# Patient Record
Sex: Male | Born: 1960 | Race: White | Hispanic: No | Marital: Married | Smoking: Former smoker
Health system: Southern US, Community
[De-identification: ages and names within clinical notes are randomized; demographics above are authoritative.]

## PROBLEM LIST (undated history)

## (undated) DIAGNOSIS — M549 Dorsalgia, unspecified: Principal | ICD-10-CM

## (undated) DIAGNOSIS — F329 Major depressive disorder, single episode, unspecified: Secondary | ICD-10-CM

## (undated) DIAGNOSIS — I2510 Atherosclerotic heart disease of native coronary artery without angina pectoris: Secondary | ICD-10-CM

## (undated) DIAGNOSIS — F32A Anxiety disorder, unspecified: Secondary | ICD-10-CM

## (undated) DIAGNOSIS — F419 Anxiety disorder, unspecified: Secondary | ICD-10-CM

## (undated) DIAGNOSIS — I219 Acute myocardial infarction, unspecified: Secondary | ICD-10-CM

## (undated) DIAGNOSIS — G459 Transient cerebral ischemic attack, unspecified: Secondary | ICD-10-CM

## (undated) DIAGNOSIS — G8929 Other chronic pain: Secondary | ICD-10-CM

## (undated) HISTORY — DX: Dorsalgia, unspecified: M54.9

## (undated) HISTORY — DX: Anxiety disorder, unspecified: F32.A

## (undated) HISTORY — DX: Major depressive disorder, single episode, unspecified: F32.9

## (undated) HISTORY — PX: TONSILLECTOMY: SUR1361

## (undated) HISTORY — DX: Transient cerebral ischemic attack, unspecified: G45.9

## (undated) HISTORY — DX: Acute myocardial infarction, unspecified: I21.9

## (undated) HISTORY — DX: Other chronic pain: G89.29

## (undated) HISTORY — PX: BACK SURGERY: SHX140

## (undated) HISTORY — DX: Anxiety disorder, unspecified: F41.9

## (undated) HISTORY — DX: Atherosclerotic heart disease of native coronary artery without angina pectoris: I25.10

---

## 2002-07-24 ENCOUNTER — Encounter: Admission: RE | Admit: 2002-07-24 | Discharge: 2002-07-24 | Payer: Self-pay | Admitting: Neurosurgery

## 2002-07-24 ENCOUNTER — Encounter: Payer: Self-pay | Admitting: Neurosurgery

## 2002-09-11 ENCOUNTER — Encounter: Payer: Self-pay | Admitting: Neurosurgery

## 2002-09-11 ENCOUNTER — Inpatient Hospital Stay (HOSPITAL_COMMUNITY): Admission: RE | Admit: 2002-09-11 | Discharge: 2002-09-15 | Payer: Self-pay | Admitting: Neurosurgery

## 2002-10-29 ENCOUNTER — Encounter: Payer: Self-pay | Admitting: Neurosurgery

## 2002-10-29 ENCOUNTER — Encounter: Admission: RE | Admit: 2002-10-29 | Discharge: 2002-10-29 | Payer: Self-pay | Admitting: Neurosurgery

## 2002-12-17 ENCOUNTER — Encounter: Admission: RE | Admit: 2002-12-17 | Discharge: 2002-12-17 | Payer: Self-pay | Admitting: Neurosurgery

## 2002-12-17 ENCOUNTER — Encounter: Payer: Self-pay | Admitting: Neurosurgery

## 2007-05-20 ENCOUNTER — Ambulatory Visit (HOSPITAL_COMMUNITY): Admission: RE | Admit: 2007-05-20 | Discharge: 2007-05-20 | Payer: Self-pay | Admitting: Family Medicine

## 2007-05-29 ENCOUNTER — Ambulatory Visit (HOSPITAL_COMMUNITY): Admission: RE | Admit: 2007-05-29 | Discharge: 2007-05-29 | Payer: Self-pay | Admitting: Anesthesiology

## 2007-05-30 ENCOUNTER — Ambulatory Visit (HOSPITAL_COMMUNITY): Admission: RE | Admit: 2007-05-30 | Discharge: 2007-05-30 | Payer: Self-pay | Admitting: Anesthesiology

## 2007-05-31 ENCOUNTER — Emergency Department (HOSPITAL_COMMUNITY): Admission: EM | Admit: 2007-05-31 | Discharge: 2007-05-31 | Payer: Self-pay | Admitting: Emergency Medicine

## 2009-05-03 ENCOUNTER — Encounter: Admission: RE | Admit: 2009-05-03 | Discharge: 2009-05-03 | Payer: Self-pay | Admitting: Neurosurgery

## 2009-05-23 ENCOUNTER — Ambulatory Visit (HOSPITAL_COMMUNITY): Admission: RE | Admit: 2009-05-23 | Discharge: 2009-05-24 | Payer: Self-pay | Admitting: Neurosurgery

## 2009-06-21 ENCOUNTER — Encounter: Admission: RE | Admit: 2009-06-21 | Discharge: 2009-06-21 | Payer: Self-pay | Admitting: Neurosurgery

## 2009-08-18 ENCOUNTER — Encounter: Admission: RE | Admit: 2009-08-18 | Discharge: 2009-08-18 | Payer: Self-pay | Admitting: Neurosurgery

## 2009-09-21 ENCOUNTER — Encounter: Admission: RE | Admit: 2009-09-21 | Discharge: 2009-09-21 | Payer: Self-pay | Admitting: Neurosurgery

## 2009-11-24 DIAGNOSIS — I219 Acute myocardial infarction, unspecified: Secondary | ICD-10-CM

## 2009-11-24 HISTORY — DX: Acute myocardial infarction, unspecified: I21.9

## 2010-01-24 DIAGNOSIS — G459 Transient cerebral ischemic attack, unspecified: Secondary | ICD-10-CM

## 2010-01-24 HISTORY — DX: Transient cerebral ischemic attack, unspecified: G45.9

## 2010-02-01 ENCOUNTER — Encounter: Admission: RE | Admit: 2010-02-01 | Discharge: 2010-02-01 | Payer: Self-pay | Admitting: Neurosurgery

## 2010-04-16 ENCOUNTER — Encounter: Payer: Self-pay | Admitting: Neurosurgery

## 2010-04-16 ENCOUNTER — Encounter: Payer: Self-pay | Admitting: Anesthesiology

## 2010-06-14 LAB — TYPE AND SCREEN: ABO/RH(D): A POS

## 2010-06-14 LAB — DIFFERENTIAL
Basophils Relative: 0 % (ref 0–1)
Eosinophils Absolute: 0.2 10*3/uL (ref 0.0–0.7)
Lymphocytes Relative: 22 % (ref 12–46)
Neutro Abs: 6 10*3/uL (ref 1.7–7.7)
Neutrophils Relative %: 71 % (ref 43–77)

## 2010-06-14 LAB — CBC
MCHC: 34 g/dL (ref 30.0–36.0)
MCV: 90.3 fL (ref 78.0–100.0)

## 2010-06-14 LAB — ABO/RH: ABO/RH(D): A POS

## 2010-06-14 LAB — SURGICAL PCR SCREEN: MRSA, PCR: NEGATIVE

## 2010-08-02 ENCOUNTER — Other Ambulatory Visit: Payer: Self-pay | Admitting: Neurosurgery

## 2010-08-02 DIAGNOSIS — M545 Low back pain, unspecified: Secondary | ICD-10-CM

## 2010-08-08 NOTE — Consult Note (Signed)
NAMEDESHAWN, WITTY NO.:  [id number]   MEDICAL RECORD NO.:  [id number]          PATIENT TYPE:  EMS   LOCATION:  ED                           FACILITY:  [facility]   PHYSICIAN:  Alvy Beal, MD    DATE OF BIRTH:  Jun 01, 1960   DATE OF CONSULTATION:  DATE OF DISCHARGE:  05/31/2007                                 CONSULTATION   HISTORY:  Charles Gross is a very pleasant 50 year old gentleman with chronic  low back pain for which he is on chronic pain medications.  He states  that in mid January he went to a fantasy baseball camp, and he kind of  felt like he injured his left anterior groin.  He has been walking on it  with pain ever since.  Ultimately he was referred for an MRI, and the  results returned today with a stress fracture of the left femoral neck.  As a result, he was instructed to go to the ER for orthopedic evaluation  by his pain medical management specialist.  As a result, I was consulted  for further management.   His past medical, surgical, family, and social history includes chronic  low back pain.  He is otherwise healthy.  He is currently taking  multiple medications for his chronic pain.   He is otherwise healthy with no other significant medical problems per  his report.   CLINICAL EXAMINATION:  He is a pleasant gentleman and appears stated  age.  He is able to stand, but he does have weightbearing pain on the  left side.  He has minimal pain with internal and external rotation of  the hip.  There is no leg length discrepancy.  There is no abnormal  rotation of the lower extremity.  He has no knee or ankle pain with  palpation.  He is neurovascularly intact.   The MRI does demonstrate a stress reaction and T1 changes consistent  with a stress fracture of the compression side of the femoral neck.   CLINICAL IMPRESSION:  Stress fracture.  At this point in time because he  does have weightbearing pain I am going to put him on crutches with  strict  nonweightbearing.  I will have him see Dr. Charlann Boxer either Monday or  Tuesday to discuss further management, whether that would be surgical  intervention or ongoing conservative management with crutches.  At this  point all of his questions were addressed.  I did indicate that if he  fails to be able to ambulate with nonweightbearing with crutches then  more than likely we will have to admit him for physical therapy.      Alvy Beal, MD  Electronically Signed     DDB/MEDQ  D:  05/31/2007  T:  06/02/2007  Job:  [id number]   cc:   Dr. Jacqulyn Bath  Pain Medical Management Specialist

## 2010-08-11 NOTE — Op Note (Signed)
NAMELINO, WICKLIFF                         ACCOUNT NO.:  [id number]   MEDICAL RECORD NO.:  [id number]                   PATIENT TYPE:  INP   LOCATION:  2899                                 FACILITY:  [facility]   PHYSICIAN:  Sherilyn Cooter A. Pool, M.D.                 DATE OF BIRTH:  Nov 10, 1960   DATE OF PROCEDURE:  09/11/2002  DATE OF DISCHARGE:                                 OPERATIVE REPORT   PREOPERATIVE DIAGNOSIS:  L4-5 and L5-S1 degenerative disk disease with  instability and stenosis.   POSTOPERATIVE DIAGNOSIS:  L4-5 and L5-S1 degenerative disk disease with  instability and stenosis.   PROCEDURE:  1. Re-exploration of L4-5 laminectomy with complete L4-5 laminectomy and     foraminotomies.  2. Re-exploration of L5-S1 laminectomy with complete laminectomy and     foraminotomies.  3. L4-5 and L5-S1 posterior lumbar interbody fusion utilizing Tangent wedges     and local autograft.  4. L4-5 and L5-S1 posterolateral fusion utilizing pedicle screw     instrumentation and local autografting.   SURGEON:  Kathaleen Maser. Pool, M.D.   ASSISTANT:  Donalee Citrin, M.D.   ANESTHESIA:  General endotracheal.   INDICATIONS:  Mr. Juhnke is a 50 year old male with a history of severe  back pain and bilateral lower extremity symptoms failing all conservative  management.  Workup has demonstrated evidence of postlaminectomy instability  with severe degenerative disk disease at the L4-5 and L5-S1 levels.  We  discussed options available for management, including the possibility of  undergoing an L4-5 and L5-S1 decompression and fusion with instrumentation  for hopeful improvement in his symptoms.  The patient is aware of the risks  and benefits and wishes to proceed.   OPERATIVE NOTE:  The patient was placed on the operating table in the supine  position.  After an adequate level of anesthesia was achieved, the patient  was positioned prone onto a Wilson frame and appropriately padded.  The  patient's  lumbar region was prepped and draped sterilely.  A 10 blade was  used to make a linear skin incision overlying the L3, L4, L5, and S1 levels.  This was carried down sharply in the midline.  A subperiosteal dissection  was then performed, exposing the laminae and facet joints of L3, L4, L5, and  S1.  Dissection proceeded out laterally, and the transverse processes of L4  and L5 and the sacral ala were then exposed bilaterally.  A deep self-  retaining retractor was placed.  The previous laminectomy at L4-5 was  dissected free using dental instruments, as was the previous laminotomy at  L5-S1.  Complete laminectomies at L4, L5, and S1 were then performed using  Kerrison rongeurs, Leksell rongeurs, and the high-speed drill.  All elements  of the laminae of L5 an L4 were completely removed.  The superior aspect of  the S1 lamina was also removed.  Inferior facets of L4  and L5 were  completely removed bilaterally.  Superior facets of L5 and S1 were also  resected bilaterally.  Underlying thecal sac and exiting L4, L5, and S1  nerve roots were identified.  Wide foraminotomies were performed along the  course of the exiting nerve root.  Epidural venous plexus is coagulated and  cut.  Epidural scar was dissected free and lysed.  Starting first at L5-S1  on the right side, thecal sac and nerve roots were gradually mobilized and  retracted toward the midline.  The disk space was identified and incised  with a 15 blade in a rectangular fashion.  A wide disk space clean-out was  then achieved using pituitary rongeurs, upward-angled pituitary rongeurs,  and Epstein curettes.  All loose or obviously degenerative disk material was  removed from the interspace.  A 10 mm distractor was left on the patient's  right side.  Attention was then placed to the left.  The disk space was then  incised with the nerve roots protected.  An aggressive diskectomy was once  again performed.  Furthermore, 10 mm Tangent  instruments were introduced.  A  10 mm reamer was used to clean away the remaining disk material, and a 10 mm  chisel was used to prepare the vertebral end plates.  All soft tissue was  removed from the interspace.  A 10 x 26 mm Tangent wedge was then impacted  into place and recessed approximately 1 mm from the posterior cortical  margin.  The distractor was removed from the patient's right side.  The disk  space was then reamed and then cut with a 10 mm chisel on the right.  The  thecal sac and nerve roots continued to be protected.  The interspace was  then packed with morcellized autograft and then a second 10 x 26 mm wedge  was impacted into place.  The procedure was then repeated bilaterally at the  L4-5 level, again without complication.  The pedicles at L4, L5, and S1 were  then isolated using surface landmarks and intraoperative fluoroscopy.  Superficial bone overlying the pedicles was removed using the high-speed  drill.  Each pedicle was then probed using a pedicle awl.  Each pedicle awl  track was found to be solidly within bone.  Each pedicle awl track was then  tapped with a 5.25 mm screw tap.  Spiral 90 6.75 x 45 mm screws were placed  bilaterally at L4, 6.75 x 40 mm screws were placed bilaterally at L5, and  6.75 x 35 mm screws were placed bilaterally at S1.  The transverse processes  and sacral alae were then decorticated using the high-speed drill.  Morcellized autograft was packed posterolaterally.  A segment of titanium  rod was then contoured and placed over the screw heads at L4, L5, and S1.  Locking caps were then engaged in a sequential fashion with the construct  under compression.  A transverse connector.  Final images revealed good  position of bone grafts and hardware, proper operative level, with normal  alignment of the spine.  A small inadvertent dural laceration was oversewn using 5-0 Prolene.  Duragen and Tisseel fibrin glue sealant were used to  help  further close this.  Gelfoam was placed in the epidural space.  A  medium Hemovac drain was left in the epidural space.  The wound was then  closed in a typical fashion using layers of Vicryl sutures.  Steri-Strips  and sterile dressing were applied.  There were no  apparent complications.  The patient tolerated the procedure well, and he returns to the recovery  room postoperatively.                                               Henry A. Pool, M.D.    HAP/MEDQ  D:  09/11/2002  T:  09/14/2002  Job:  [id number]

## 2010-08-11 NOTE — Discharge Summary (Signed)
   Charles, Gross                         ACCOUNT NO.:  [id number]   MEDICAL RECORD NO.:  [id number]                   PATIENT TYPE:  INP   LOCATION:  3009                                 FACILITY:  [facility]   PHYSICIAN:  Sherilyn Cooter A. Pool, M.D.                 DATE OF BIRTH:  [birth year]   DATE OF ADMISSION:  09/11/2002  DATE OF DISCHARGE:  09/15/2002                                 DISCHARGE SUMMARY   FINAL DIAGNOSIS:  L4-5 and L5-S1 degenerative disk disease with stenosis.   HOSPITAL COURSE:  The patient was taken to the operating room where an  uncomplicated two-level decompression and fusion with instrumentation was  performed.  Postoperatively, the patient did well.  He was gradually  mobilized without difficulty.  Pain management was improved.   CONDITION AT DISCHARGE:  Improved.   FOLLOW UP:  Discharge followup is in one week in my office.                                                Henry A. Pool, M.D.    HAP/MEDQ  D:  10/29/2002  T:  10/29/2002  Job:  [id number]

## 2010-08-15 ENCOUNTER — Other Ambulatory Visit: Payer: Self-pay | Admitting: Neurosurgery

## 2010-08-15 DIAGNOSIS — M545 Low back pain, unspecified: Secondary | ICD-10-CM

## 2010-08-17 ENCOUNTER — Ambulatory Visit
Admission: RE | Admit: 2010-08-17 | Discharge: 2010-08-17 | Disposition: A | Discharge status: Home / Self Care | Payer: BC Managed Care – PPO | Source: Ambulatory Visit | Attending: Neurosurgery | Admitting: Neurosurgery

## 2010-08-17 DIAGNOSIS — M545 Low back pain, unspecified: Secondary | ICD-10-CM

## 2011-02-08 ENCOUNTER — Ambulatory Visit (INDEPENDENT_AMBULATORY_CARE_PROVIDER_SITE_OTHER): Payer: BC Managed Care – PPO | Admitting: Internal Medicine

## 2011-02-08 ENCOUNTER — Encounter: Payer: Self-pay | Admitting: Internal Medicine

## 2011-02-08 VITALS — BP 120/80 | HR 82 | Temp 98.6°F | Ht 72.0 in | Wt 184.0 lb

## 2011-02-08 DIAGNOSIS — G8929 Other chronic pain: Secondary | ICD-10-CM | POA: Insufficient documentation

## 2011-02-08 DIAGNOSIS — M549 Dorsalgia, unspecified: Secondary | ICD-10-CM

## 2011-02-08 NOTE — Assessment & Plan Note (Addendum)
Patient is here exclusively for pain management, which is not my specialty. I wonder why his previous PCP or his back surgeon are declining to prescribe him pain medication. He states that he is back surgeon has already refer him to pain management and he has an appointment in 2 months. With the amount of medicine that he takes and particularly with  the concomitant use of Fioricet and soma I don't think is appropriate for me to prescribe his pain medicines going outside the boundaries of my specialty. He is concerned about withdrawal and so I am . He is recommended to go to the ER should he develop symptoms of withdrawal such as nausea, mental status changes, tachycardia etc.

## 2011-02-08 NOTE — Progress Notes (Signed)
  Subjective:    Patient ID: Charles Gross, male    DOB: 1961-01-17, 50 y.o.   MRN: [id number]  HPI New patient He is here specifically for pain management, reports a long history of back pain, reports that Dr. Dutch Quint, his back surgeon has been prescribing all his medications but lately has  declined to keep prescribing them and he has been referred to a  pain management specialist. He also has a PCP, Dr.Kelly who will not prescribe his pain medicine for him. "I don't have a good relationship with him"  Past Medical History  Diagnosis Date  . Myocardial infarction 11-2009  . TIA (transient ischemic attack) 01-2010  . Chronic back pain since 2000  . Migraine   . Anxiety and depression    Past Surgical History  Procedure Date  . Back surgery     x1 [place], fusion 2004-2010 Dr Dutch Quint   History   Social History  . Marital Status: Married    Spouse Name: N/A    Number of Children: 1  . Years of Education: N/A   Occupational History  . disability    Social History Main Topics  . Smoking status: Former Smoker    Quit date: 02/08/2008  . Smokeless tobacco: Never Used  . Alcohol Use: Yes     glass of wine nightly  . Drug Use: No  . Sexually Active: Not on file   Other Topics Concern  . Not on file   Social History Narrative  . No narrative on file   Family History  Problem Relation Age of Onset  . Heart attack Father 63    first at age 25, had 3 total  . Colon cancer Neg Hx   . Prostate cancer Neg Hx     Review of Systems No other concerns    Objective:   Physical Exam Alert, oriented x3, in no apparent distress He has a back brace       Assessment & Plan:

## 2011-02-13 ENCOUNTER — Encounter (HOSPITAL_BASED_OUTPATIENT_CLINIC_OR_DEPARTMENT_OTHER): Payer: Self-pay | Admitting: *Deleted

## 2011-02-13 ENCOUNTER — Emergency Department (HOSPITAL_BASED_OUTPATIENT_CLINIC_OR_DEPARTMENT_OTHER)
Admission: EM | Admit: 2011-02-13 | Discharge: 2011-02-13 | Disposition: A | Discharge status: Home / Self Care | Payer: BC Managed Care – PPO | Attending: Emergency Medicine | Admitting: Emergency Medicine

## 2011-02-13 DIAGNOSIS — I252 Old myocardial infarction: Secondary | ICD-10-CM | POA: Insufficient documentation

## 2011-02-13 DIAGNOSIS — F341 Dysthymic disorder: Secondary | ICD-10-CM | POA: Insufficient documentation

## 2011-02-13 DIAGNOSIS — Z8679 Personal history of other diseases of the circulatory system: Secondary | ICD-10-CM | POA: Insufficient documentation

## 2011-02-13 DIAGNOSIS — G8929 Other chronic pain: Secondary | ICD-10-CM

## 2011-02-13 DIAGNOSIS — M549 Dorsalgia, unspecified: Secondary | ICD-10-CM | POA: Insufficient documentation

## 2011-02-13 MED ORDER — FENTANYL 100 MCG/HR TD PT72: 1.0000 | MEDICATED_PATCH | TRANSDERMAL | Status: AC

## 2011-02-13 MED ORDER — OXYCODONE-ACETAMINOPHEN 5-325 MG PO TABS: 2.0000 | ORAL_TABLET | ORAL | Status: AC | PRN

## 2011-02-13 NOTE — ED Provider Notes (Signed)
History     CSN: [id number] Arrival date & time: 02/13/2011  8:40 AM   First MD Initiated Contact with Patient 02/13/11 [phone]      Chief Complaint  Patient presents with  . Back Pain    (Consider location/radiation/quality/duration/timing/severity/associated sxs/prior treatment) Patient is a 50 y.o. male presenting with back pain. The history is provided by the patient.  Back Pain  This is a chronic problem. Pertinent negatives include no fever.   patient is requesting refill of his pain medications. He states he is chronic back pain and his surgeon Dr. Jordan Likes recently said he would not keep filling his pain medicines at this is not a medicine doctor. He has followup in medicine clinic in December but he just ran out of meds. He came to see Dr. Drue Novel to get more pain medications, but he would not fill it. Patient states he's had increasing back pain, but no new injury. No new weakness.  Past Medical History  Diagnosis Date  . Myocardial infarction 11-2009  . TIA (transient ischemic attack) 01-2010  . Chronic back pain since 2000  . Migraine   . Anxiety and depression     Past Surgical History  Procedure Date  . Back surgery     x1 [place], fusion 2004-2010 Dr Dutch Quint    Family History  Problem Relation Age of Onset  . Heart attack Father 78    first at age 75, had 3 total  . Colon cancer Neg Hx   . Prostate cancer Neg Hx     History  Substance Use Topics  . Smoking status: Former Smoker    Quit date: 02/08/2008  . Smokeless tobacco: Never Used  . Alcohol Use: Yes     glass of wine nightly      Review of Systems  Constitutional: Negative for fever.  Musculoskeletal: Positive for back pain. Negative for joint swelling and gait problem.    Allergies  Review of patient's allergies indicates no known allergies.  Home Medications   Current Outpatient Rx  Name Route Sig Dispense Refill  . BUTALBITAL-APAP-CAFF-COD 50-325-40-30 MG PO CAPS Oral Take 1 capsule by  mouth every 4 (four) hours as needed.      Marland Kitchen CARISOPRODOL 350 MG PO TABS Oral Take 350 mg by mouth 4 (four) times daily as needed.      Marland Kitchen CARVEDILOL 6.25 MG PO TABS  Dose?     . FENTANYL 100 MCG/HR TD PT72 Transdermal Place 2 patches onto the skin every other day.      . FENTANYL 100 MCG/HR TD PT72 Transdermal Place 1 patch (100 mcg total) onto the skin every 3 (three) days. 10 patch 0  . OXYCODONE HCL 30 MG PO TB12 Oral Take 30 mg by mouth 4 (four) times daily.     Marland Kitchen PRASUGREL HCL 10 MG PO TABS Oral Take by mouth.        BP 145/90  Pulse 81  Temp(Src) 98.6 F (37 C) (Oral)  Resp 18  SpO2 100%  Physical Exam  Constitutional: He appears well-developed.  Musculoskeletal:       Tender over lumbar spine with back brace on. No peripheral weakness.  Skin: Skin is warm.    ED Course  Procedures (including critical care time)  Labs Reviewed - No data to display No results found.   1. Chronic back pain       MDM  Patient is requesting refills on his soma fentanyl and OxyContin. He has a medicine  clinic followup. He was informed that he'll not be getting the OxyContin or soma refilled, will be given a fentanyl patch and some Percocets. He is instructed to follow with the pain medication clinic.        Juliet Rude. Rubin Payor, MD 02/13/11 [phone]

## 2011-02-13 NOTE — ED Notes (Signed)
Patient states he has chronic back pain and has been recently released by surgeon and appointment with [facility] in December, ran out of meds yesterday, took oxycodone & fioricet, pain uncontrolled at this time

## 2011-05-01 ENCOUNTER — Emergency Department (HOSPITAL_COMMUNITY)
Admission: EM | Admit: 2011-05-01 | Discharge: 2011-05-01 | Disposition: A | Discharge status: Home / Self Care | Payer: BC Managed Care – PPO | Source: Home / Self Care | Attending: Family Medicine | Admitting: Family Medicine

## 2011-05-01 ENCOUNTER — Encounter (HOSPITAL_COMMUNITY): Payer: Self-pay | Admitting: *Deleted

## 2011-05-01 DIAGNOSIS — G8929 Other chronic pain: Secondary | ICD-10-CM

## 2011-05-01 DIAGNOSIS — M549 Dorsalgia, unspecified: Secondary | ICD-10-CM

## 2011-05-01 MED ORDER — FENTANYL 100 MCG/HR TD PT72: 1.0000 | MEDICATED_PATCH | TRANSDERMAL | Status: DC

## 2011-05-01 NOTE — ED Provider Notes (Signed)
History     CSN: [id number]  Arrival date & time 05/01/11  1102   First MD Initiated Contact with Patient 05/01/11 1231      Chief Complaint  Patient presents with  . Back Pain    (Consider location/radiation/quality/duration/timing/severity/associated sxs/prior treatment) HPI Comments: Charles Gross presents for evaluation of chronic back pain. He has had several back surgeries in the past, including a laminectomy and 2 fusions. At least one of the surgeries was done at [facility]. His back pain started in the year 2000. He is now in chronic pain and uses fentanyl patches for relief. He was [facility] in [place]. They declined to do surgery. They referred him back to [facility] for further evaluation. He states that he is out of his patches today. But he does have a followup appointment this Thursday with [facility] in [place], [place]. He denies any numbness, tingling, or weakness in his lower extremities. He denies any bowel or bladder incontinence.  Patient is a 51 y.o. male presenting with back pain.  Back Pain  This is a chronic problem. The current episode started more than 1 week ago. The problem occurs constantly. The problem has not changed since onset.The pain is associated with no known injury. The pain is present in the lumbar spine. The quality of the pain is described as shooting and stabbing. The pain radiates to the left thigh and right thigh. The pain is at a severity of 10/10. The pain is severe. The symptoms are aggravated by bending, twisting and certain positions. Associated symptoms include leg pain. Pertinent negatives include no numbness, no bowel incontinence, no perianal numbness, no bladder incontinence, no paresthesias, no paresis, no tingling and no weakness. He has tried analgesics for the symptoms.    Past Medical History  Diagnosis Date  . Myocardial infarction 11-2009  . TIA (transient ischemic attack)  01-2010  . Chronic back pain since 2000  . Migraine   . Anxiety and depression     Past Surgical History  Procedure Date  . Back surgery     x1 [place], fusion 2004-2010 Dr Dutch Quint    Family History  Problem Relation Age of Onset  . Heart attack Father 75    first at age 68, had 3 total  . Colon cancer Neg Hx   . Prostate cancer Neg Hx     History  Substance Use Topics  . Smoking status: Former Smoker    Quit date: 02/08/2008  . Smokeless tobacco: Never Used  . Alcohol Use: Yes     glass of wine nightly      Review of Systems  Constitutional: Negative.   HENT: Negative.   Eyes: Negative.   Respiratory: Negative.   Cardiovascular: Negative.   Gastrointestinal: Negative.  Negative for bowel incontinence.  Genitourinary: Negative.  Negative for bladder incontinence.  Musculoskeletal: Positive for back pain. Negative for gait problem.  Skin: Negative.   Neurological: Negative.  Negative for tingling, weakness, numbness and paresthesias.    Allergies  Review of patient's allergies indicates no known allergies.  Home Medications   Current Outpatient Rx  Name Route Sig Dispense Refill  . ASPIRIN 81 MG PO TABS Oral Take 81 mg by mouth daily.      Marland Kitchen BUTALBITAL-APAP-CAFF-COD 50-325-40-30 MG PO CAPS Oral Take 2 capsules by mouth every 4 (four) hours as needed. headaches    . CARISOPRODOL 350 MG PO TABS Oral Take 350 mg by mouth 4 (four)  times daily as needed. For muscle spasms    . CARVEDILOL 3.125 MG PO TABS Oral Take 3.125 mg by mouth 2 (two) times daily with a meal.      . CO-ENZYME Q-10 50 MG PO CAPS Oral Take 50 mg by mouth daily.      Marland Kitchen PRASUGREL HCL 10 MG PO TABS Oral Take 10 mg by mouth daily.     . SUMATRIPTAN SUCCINATE 50 MG PO TABS Oral Take 100 mg by mouth daily. For headache     . SUMATRIPTAN SUCCINATE 6 MG/0.5ML  SOLN Subcutaneous Inject 6 mg into the skin daily. For headaches if needed     . TAMSULOSIN HCL 0.4 MG PO CAPS Oral Take 0.4 mg by mouth 2  (two) times daily.      . FENTANYL 100 MCG/HR TD PT72 Transdermal Place 1 patch (100 mcg total) onto the skin every other day. Applied on 02-11-2011 due for change 02-13-2011 5 patch 0    BP 120/70  Pulse 70  Temp(Src) 98.2 F (36.8 C) (Oral)  Resp 18  SpO2 100%  Physical Exam  Nursing note and vitals reviewed. Constitutional: He is oriented to person, place, and time. He appears well-developed and well-nourished.  HENT:  Head: Normocephalic and atraumatic.  Eyes: EOM are normal.  Neck: Normal range of motion.  Pulmonary/Chest: Effort normal.  Musculoskeletal: Normal range of motion.       Very large vertical incision extending throughout the lumbar region. Tenderness to palpation over lumbar spine, spinous processes, and paraspinal musculature. 5/5 strength in lower extremities.  Neurological: He is alert and oriented to person, place, and time.  Skin: Skin is warm and dry.  Psychiatric: His behavior is normal.    ED Course  Procedures (including critical care time)  Labs Reviewed - No data to display No results found.   1. Chronic back pain       MDM  Refilled few Fentanyl patches; will follow up with [facility] on Thursday        Richardo Priest, MD 05/01/11 1331

## 2011-05-01 NOTE — ED Notes (Signed)
Sp 3 lumbar surgeries surgeries .  He saw a spine specialist in [place] last week and was told to get set up with a pain clinic.  He does not have a PCP. HE has chronic lumbar pain

## 2011-07-06 ENCOUNTER — Other Ambulatory Visit: Payer: Self-pay | Admitting: Pain Medicine

## 2011-07-06 DIAGNOSIS — M549 Dorsalgia, unspecified: Secondary | ICD-10-CM

## 2011-07-13 ENCOUNTER — Ambulatory Visit
Admission: RE | Admit: 2011-07-13 | Discharge: 2011-07-13 | Disposition: A | Discharge status: Home / Self Care | Payer: BC Managed Care – PPO | Source: Ambulatory Visit | Attending: Pain Medicine | Admitting: Pain Medicine

## 2011-07-13 DIAGNOSIS — M549 Dorsalgia, unspecified: Secondary | ICD-10-CM

## 2012-01-30 IMAGING — CT CT L SPINE W/O CM
4 of 11 series · 12 of 33 positions shown, 13 images · non-contrast
Comparison: None.

CLINICAL DATA: Low back pain.  Previous fusion surgery.

CT LUMBAR SPINE WITHOUT CONTRAST
TECHNIQUE: Multidetector CT imaging of the lumbar spine was
performed without intravenous contrast administration. Multiplanar
CT image reconstructions were also generated.

[Series 3: l spine bone · axial · 0.27mm/px · z∈[+14,+94]mm · 2 of 98 slices shown, 3 images]
[im 33/98  soft-tissue]
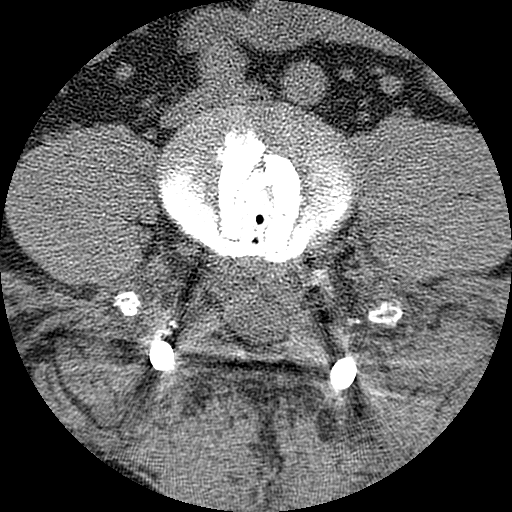
[im 33/98  bone]
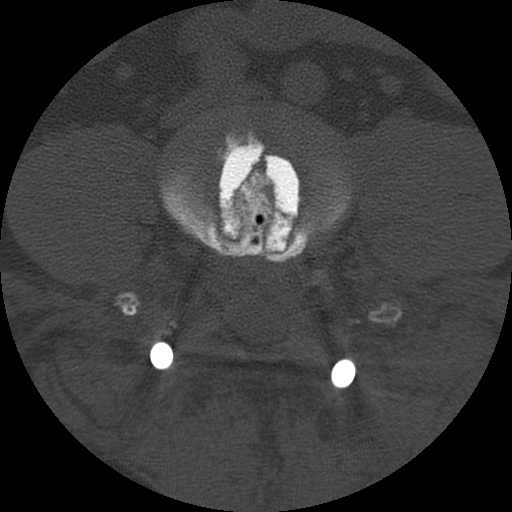
[im 65/98  bone]
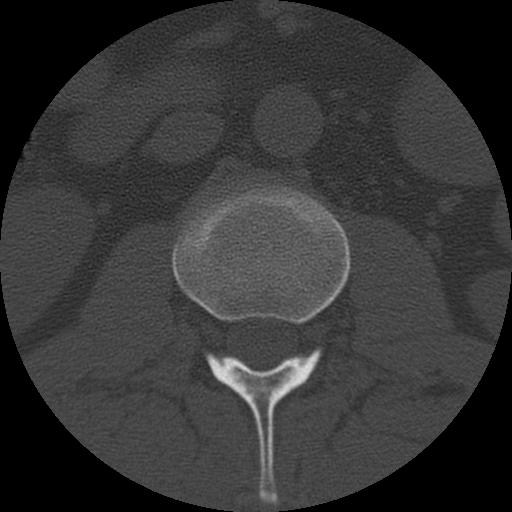

[Series 4: l spine soft · axial · 0.27mm/px · z∈[+14,+94]mm · 2 of 98 slices shown]
[im 33/98  soft-tissue]
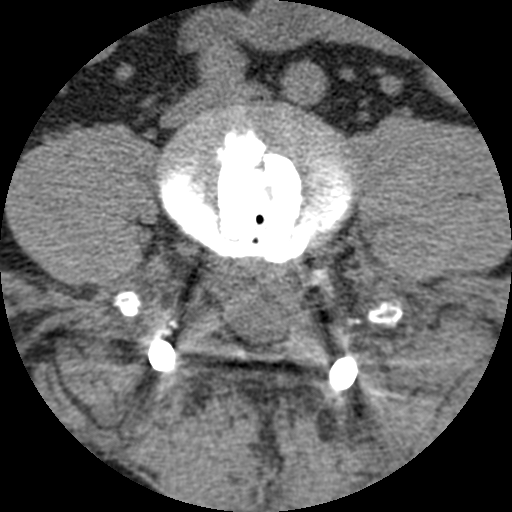
[im 65/98  soft-tissue]
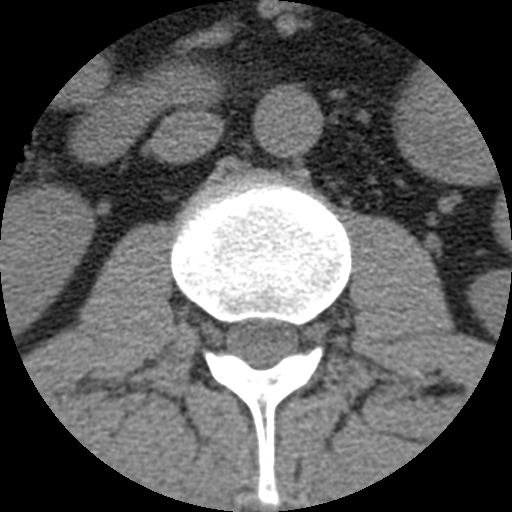

[Series 200: coronal · coronal · 0.49mm/px · 3 of 46 slices shown (1 of 2)]
[im 10/46  bone]
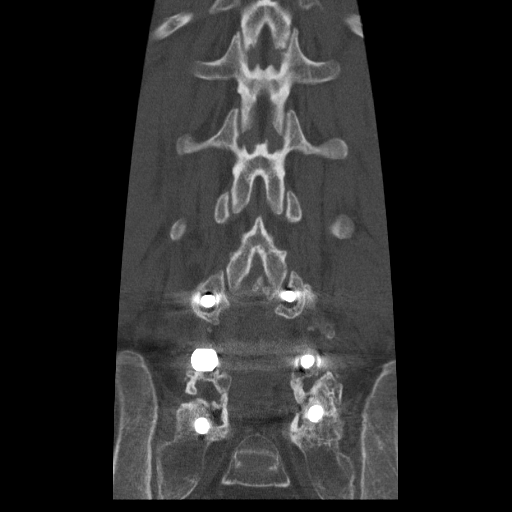
[im 19/46  bone]
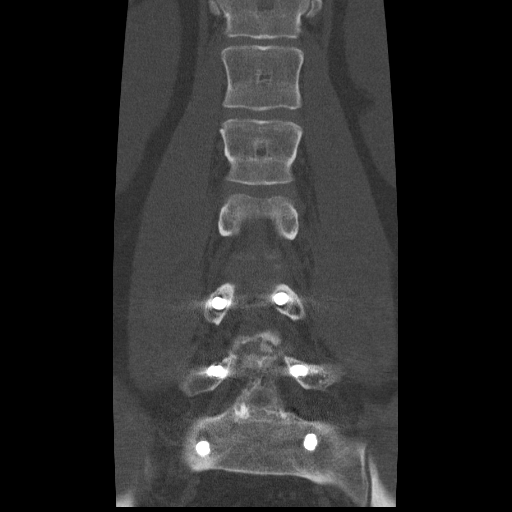
[im 28/46  bone]
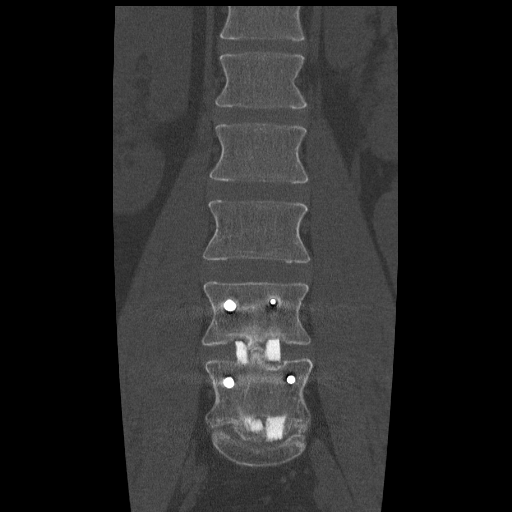

[Series 201: coronal · coronal · 0.49mm/px · 5 of 46 slices shown (2 of 2)]
[im 8/46  bone]
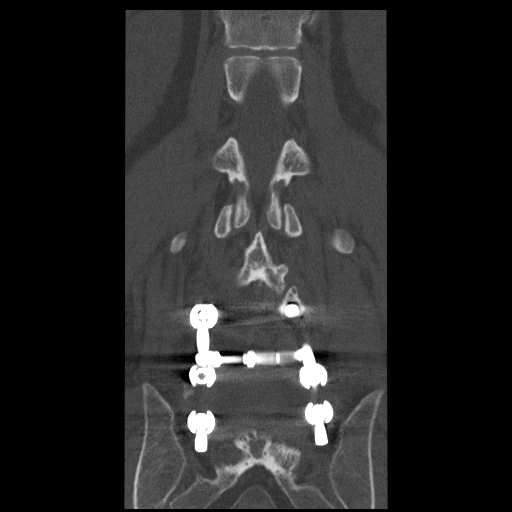
[im 16/46  bone]
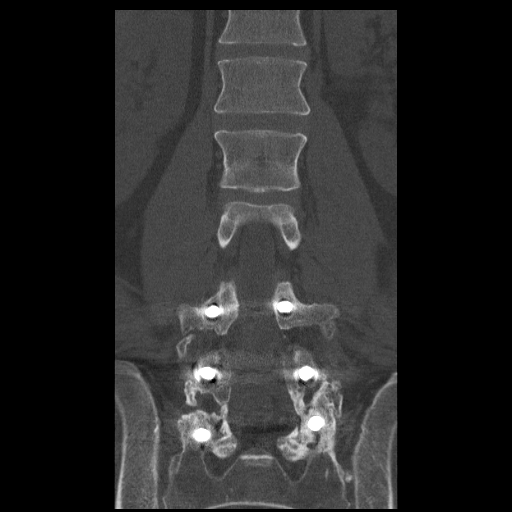
[im 23/46  bone]
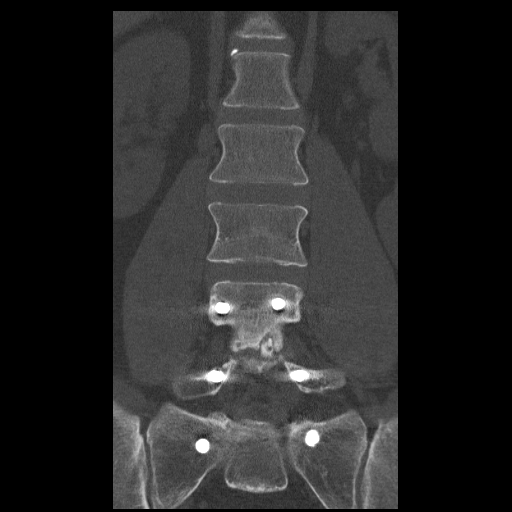
[im 31/46  bone]
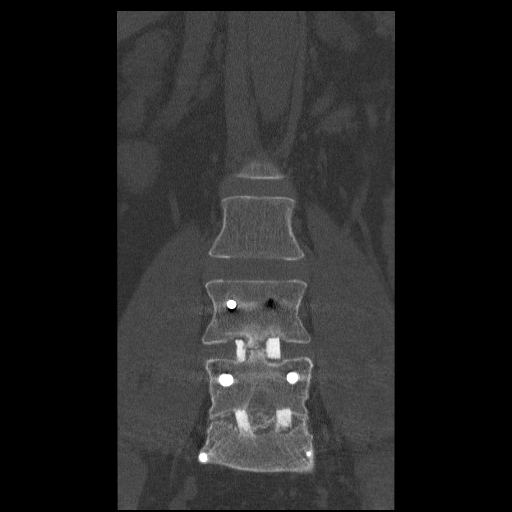
[im 38/46  bone]
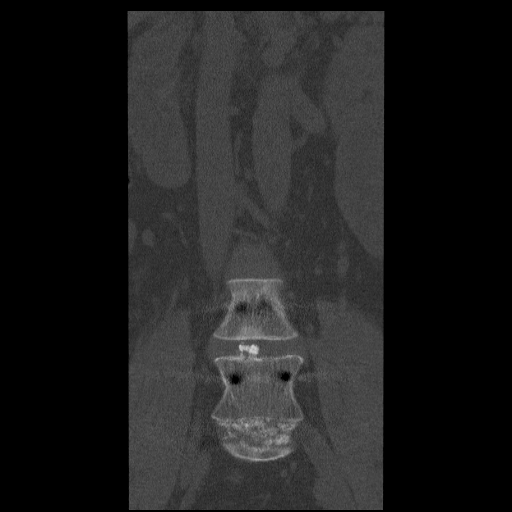

[12 of 33 positions shown; findings below may reference images not displayed]

FINDINGS: Five non-rib bearing lumbar segments labeled L1-L5.
Normal alignment.

T12-L1:  Unremarkable

L1-2:  Unremarkable

L2-3:  Unremarkable

L3-4:  Mild bilateral facet degenerative hypertrophy.  There is
some thickening of ligamentum flavum.  Very mild circumferential
disc bulge.  Foramina remain patent.

L4-5:  Changes of posterolateral and interbody fusion.  Bilateral
pedicle screws at each level with vertical interconnecting rods are
intact.  There is minimal lucency around the L4 pedicle screws.
There has been posterior decompression.  Graft appears well
positioned in the interspace but there is a irregular lucency
across the interspace with no convincing bony bridging.  There is
vacuum phenomenon at the superior margin of the graft material on
the right, also indicative of lack of solid fusion.

L5-S1:  Continuation of the posterolateral and interbody fusion
with bilateral S1 pedicle screws which appear well-positioned
without surrounding lucency.  There does appear to be solid bony
bridging across the graft in the interspace.
IMPRESSION: 1.  Mild diffuse disc bulge L3-4 without convincing compressive
pathology.
2.  Instrumented posterolateral and interbody fusion L4-5 and L5-
S1.  Vacuum phenomenon superior to the   graft in the L4-5
interspace and mild lucency around the L4 pedicle screws suggest
probable continued motion   at this level.

## 2017-05-08 ENCOUNTER — Encounter: Payer: Self-pay | Admitting: Family Medicine

## 2017-05-08 ENCOUNTER — Ambulatory Visit (INDEPENDENT_AMBULATORY_CARE_PROVIDER_SITE_OTHER): Payer: Managed Care, Other (non HMO) | Admitting: Family Medicine

## 2017-05-08 VITALS — BP 124/86 | HR 92 | Temp 98.2°F | Ht 72.0 in | Wt 191.8 lb

## 2017-05-08 DIAGNOSIS — H6691 Otitis media, unspecified, right ear: Secondary | ICD-10-CM

## 2017-05-08 DIAGNOSIS — M545 Low back pain, unspecified: Secondary | ICD-10-CM

## 2017-05-08 DIAGNOSIS — F325 Major depressive disorder, single episode, in full remission: Secondary | ICD-10-CM

## 2017-05-08 DIAGNOSIS — Z87891 Personal history of nicotine dependence: Secondary | ICD-10-CM

## 2017-05-08 DIAGNOSIS — G43909 Migraine, unspecified, not intractable, without status migrainosus: Secondary | ICD-10-CM | POA: Insufficient documentation

## 2017-05-08 DIAGNOSIS — N400 Enlarged prostate without lower urinary tract symptoms: Secondary | ICD-10-CM | POA: Insufficient documentation

## 2017-05-08 DIAGNOSIS — G43809 Other migraine, not intractable, without status migrainosus: Secondary | ICD-10-CM | POA: Diagnosis not present

## 2017-05-08 DIAGNOSIS — I2510 Atherosclerotic heart disease of native coronary artery without angina pectoris: Secondary | ICD-10-CM | POA: Diagnosis not present

## 2017-05-08 DIAGNOSIS — R351 Nocturia: Secondary | ICD-10-CM | POA: Diagnosis not present

## 2017-05-08 DIAGNOSIS — E291 Testicular hypofunction: Secondary | ICD-10-CM | POA: Insufficient documentation

## 2017-05-08 DIAGNOSIS — N401 Enlarged prostate with lower urinary tract symptoms: Secondary | ICD-10-CM | POA: Diagnosis not present

## 2017-05-08 DIAGNOSIS — B001 Herpesviral vesicular dermatitis: Secondary | ICD-10-CM | POA: Insufficient documentation

## 2017-05-08 DIAGNOSIS — G8929 Other chronic pain: Secondary | ICD-10-CM

## 2017-05-08 MED ORDER — CARISOPRODOL 350 MG PO TABS: 350.0000 mg | ORAL_TABLET | Freq: Four times a day (QID) | ORAL | 2 refills | Status: DC | PRN

## 2017-05-08 MED ORDER — AMOXICILLIN-POT CLAVULANATE 875-125 MG PO TABS: 1.0000 | ORAL_TABLET | Freq: Two times a day (BID) | ORAL | 0 refills | Status: AC

## 2017-05-08 NOTE — Assessment & Plan Note (Signed)
Asks about fiorcet refill here- I advised with him seeing neurology/headache specialist that refills should be provided through their office.

## 2017-05-08 NOTE — Assessment & Plan Note (Signed)
S: reasonable flow since being placed on flomax through urology A/P: continue current medications

## 2017-05-08 NOTE — Assessment & Plan Note (Signed)
S: Patient reports depression that has been well controlled. Psychiatry q3 months- lamictal, cymbalta 60mg , abilify 5mg , rare valium or ambien for sleep. States Ritalin through psychiatry. PHQ2 reported at 0 despite pain A/P: care everywhere states bipolar affective. Will need to update meds to reflect last psychiatry note by next visit.

## 2017-05-08 NOTE — Assessment & Plan Note (Signed)
S: follows with [facility] [place]. ASA 81mg , coreg 3.125mg  BID, zetia 10mg , atorvastatin compliant but not sure of dose, very rare nitroglycerin use- last 5 months ago.    MI age 57- 3 stents at that time. also TIA at age 57- fine by the time got to hospital.  A/P: continue current medications and cardiology follow up

## 2017-05-08 NOTE — Assessment & Plan Note (Addendum)
S:  Patient tells me of history of [place]1 [place], fusion 2004-2010 Dr Dutch QuintPoole.  Complains of 8-9/10 Low, middle, upper back pain. Not on any narcotic medications as of 2019 for back though he was on in the past. Lays in bed most of the day. Smokes marijuana and uses CBD oil to help with pain (advised against marijuana). Pain ranges 8-9/10 most of the day. Has been to numerous pain clinic- tests positive for morphine when on opiods and then he is released. Had tried cocaine in past to see if felt normal but didn't and later showed up on drug screen and kicked out of another pain clinic (apparently he has been to several including going to [place]). Was taken off fiorcet and soma as a result of positive drug screen. Has had success with narcotics but with drug screens is off- he reports always urine shows morphine even if blood does not show morphine.   Headache clinic doctor with [facility]- Baclofen recent start- states hallucinates when going to sleep. Doesn't find very helpful but shaves 9 to an 8 so wants to take this.   From last CPE 10/2016- reviewed after visit "Prescriptions by me: Zetia, Fioricet, Soma, Imitrex (tabs and injections), Valtrex Prescription by his Cardiologist, Dr Wille GlaserWallmeyer: Coreg, Lisinopril, Lipitor, Effient - this was stopped August 2018, NTG Prescriptions by his Psychiatrist Dr Sandria ManlyLove: Cymbalta, Valium, Lamictal, Ritalin, Abilify Prescriptions by his Urologist Dr. Katrinka BlazingSmith: testosterone, Cialis Has not been going to a pain specialist in over a year: Oxycodone is only from me for short courses when he goes on a trip.  We have discussed the multiple interactions and potential for harm, including death without "overdose", and he is unwilling to try lower doses or reduced frequency. He came to me already on this regimen many years ago, it has not been changed except for cutting back on the Soma from 4 a day to 3 a day. Initially he did receive opiates from me, then I insisted  he see a pain specialist and he obtained the opiates from a Pain Specialist for a few years, was discharged form one practice (we questioned the reason behind this), he went to several different practices, but has not been in the care of a pain specialty clinic in some time. I have advised him to reduce the Valium and that this in conjunction with the Fioricet with codeine is a very dangerous combination. He complains and describes intolerable pain if anything is adjusted. Yet at other times he has gone for months without the medications.  I will eventually stop the Soma. This cannot be negotiable. He says if the Tresa GarterSoma is stopped he has increased back pain. Does not recall use of Zanaflex nor Norflex, does recall no response to Robaxin, nor Flexeril.  I will insist on a referral to a headache clinic. I later insist that he re-test for the OSA and fitting for CPAP. This may improve his headaches. Print out of the NCCSDB shows that he has not been getting oxycodone from his pain clinic, and has only received a few prescriptions from me prior to trips he has taken.  " A/P: I initially agreed to up to 2 weeks of soma QID per month. After reviewing chart and noting neurologist and prior PCP want to wean him off need to reconsider this approach in the future- will plan to take him off this at next visit.. In addition did not run NCCSRS today and will plan on doing that in future.  We are going to see each other early April for perhaps 3 days of oxycodone. Since we are only using 2 week bursts of soma (350mg  up to QID)- should not have to wean him off but be able to stop instead.    We had a long discussion at time of risks about SOMA  And how he should space this from other high risk medications.

## 2017-05-08 NOTE — Progress Notes (Signed)
Phone: [phone]  Subjective:  Patient presents today to establish care.  Prior patient of Dr. Drue Novel last seen in 2012, appears he has had a few providers since then most recently being with Dr. Andi Devon with [facility] CPE 12/11/16. Chief complaint-noted.   See problem oriented charting  The following were reviewed and entered/updated in epic: Past Medical History:  Diagnosis Date  . Anxiety and depression   . CAD (coronary artery disease)    MI age 57. also TIA at age 57.   Marland Kitchen Chronic back pain since 2000  . Migraine   . Myocardial infarction (HCC) 11-2009  . TIA (transient ischemic attack) 01-2010   Patient Active Problem List   Diagnosis Date Noted  . Depression, major, single episode, complete remission (HCC) 05/08/2017    Priority: High  . CAD (coronary artery disease)     Priority: High  . Chronic back pain     Priority: High  . BPH (benign prostatic hyperplasia) 05/08/2017    Priority: Medium  . Migraine     Priority: Medium  . Hypogonadism in male 05/08/2017    Priority: Low  . Recurrent cold sores 05/08/2017    Priority: Low  . Former smoker 05/08/2017    Priority: Low   Past Surgical History:  Procedure Laterality Date  . BACK SURGERY     x1 [place] laminectomy, fusion 2002, 2011 fusion revision. Dr Dutch Quint. multiple procedures as well unsuccessful  . TONSILLECTOMY Bilateral     Family History  Problem Relation Age of Onset  . Depression Mother        died of accidental death  . Heart attack Father 70       first at age 5, had 3 total  . Hyperlipidemia Father   . Depression Sister   . Depression Brother   . Healthy Daughter   . Heart disease Paternal Grandfather   . Colon cancer Neg Hx   . Prostate cancer Neg Hx     Medications- reviewed and updated Current Outpatient Medications  Medication Sig Dispense Refill  . aspirin 81 MG tablet Take 81 mg by mouth daily.      . baclofen (LIORESAL) 10 MG tablet TK 1-2 TS PO Q 6-8 H UP TO TID PRF PAIN  2    . butalbital-acetaminophen-caffeine (FIORICET/CODEINE) 50-325-40-30 MG per capsule Take 2 capsules by mouth every 4 (four) hours as needed. headaches    . carvedilol (COREG) 3.125 MG tablet Take 3.125 mg by mouth 2 (two) times daily with a meal.      . diazepam (VALIUM) 10 MG tablet Take 1 tablet by mouth daily as needed.    . ezetimibe (ZETIA) 10 MG tablet Take 1 tablet by mouth daily.  0  . methylphenidate (RITALIN) 20 MG tablet Take 2 in the morning, 1 at noon, 1 at 3pm    . nitroGLYCERIN (NITROSTAT) 0.4 MG SL tablet PLACE 1 TABLET UNDER THE TONGUE CONTINUOUS AS NEEDED    . SUMAtriptan (IMITREX) 6 MG/0.5ML SOLN injection Inject 6 mg into the skin daily. For headaches if needed     . Testosterone 20.25 MG/ACT (1.62%) GEL Apply 1 Pump topically daily.  2  . valACYclovir (VALTREX) 1000 MG tablet Take 1 tablet by mouth 3 (three) times daily.    Added meds below as well which patient did not initially report Allergies-reviewed and updated No Known Allergies  Social History   Social History Narrative   Married. Daughter getting married April 2019. No grandkids yet- 24 in  2019.       Disability from multiple back surgeries- for about 10 years- chronic back pain since around 2000   BS at [employer]- studied journalism/advertising       ROS--Full ROS was completed Review of Systems  Constitutional: Negative for chills and fever.  HENT: Negative for hearing loss and tinnitus.   Eyes: Negative for blurred vision and double vision.  Respiratory: Negative for cough and hemoptysis.   Cardiovascular: Negative for chest pain and palpitations.  Gastrointestinal: Negative for heartburn, nausea and vomiting.  Genitourinary: Negative for dysuria and urgency.  Musculoskeletal: Positive for back pain, joint pain and myalgias. Negative for falls.  Skin: Negative for itching and rash.  Neurological: Positive for headaches. Negative for seizures and loss of consciousness.   Endo/Heme/Allergies: Negative for polydipsia. Does not bruise/bleed easily.  Psychiatric/Behavioral: Positive for hallucinations (states when takes baclofen near bedtime). Negative for substance abuse. The patient is nervous/anxious.    Objective: BP 124/86 (BP Location: Left Arm, Patient Position: Sitting, Cuff Size: Large)   Pulse 92   Temp 98.2 F (36.8 C) (Oral)   Ht 6' (1.829 m)   Wt 191 lb 12.8 oz (87 kg)   SpO2 96%   BMI 26.01 kg/m  Gen: NAD, resting comfortably HEENT: Mucous membranes are moist. Oropharynx normal. TM normal on left, on right erythematous and slightly cloudy membrane Eyes: sclera and lids normal, PERRLA Neck: no thyromegaly, no cervical lymphadenopathy CV: RRR no murmurs rubs or gallops Lungs: CTAB other than slight rhonchi at bases.  No crackles or wheeze Abdomen: soft/nontender/nondistended/normal bowel sounds.  Ext: no edema Skin: warm, dry Neuro: 5/5 strength in upper and lower extremities, normal gait, normal reflexes  Assessment/Plan:  Acute otitis media right S: right ear pressure, coughing. Early on- body aches and chills. 2 weeks of symptoms. Lingering right ear pain and cough. Denies congestion.  A/P: will treat with augmentin for 7 days to cover otitis media. Has some rhonchi in lung bases-suspect may have bronchitis as well and discussed 4-6 weeks of symptoms potentially. If he is not improving from early perspective within 10 days encouraged follow up visit.   CAD (coronary artery disease) S: follows with [facility] [place]. ASA 81mg , coreg 3.125mg  BID, zetia 10mg , atorvastatin compliant but not sure of dose, very rare nitroglycerin use- last 5 months ago.    MI age 57- 3 stents at that time. also TIA at age 57- fine by the time got to hospital.  A/P: continue current medications and cardiology follow up   Depression, major, single episode, complete remission (HCC) S: Patient reports depression that has been well controlled.  Psychiatry q3 months- lamictal, cymbalta 60mg , abilify 5mg , rare valium or ambien for sleep. States Ritalin through psychiatry. PHQ2 reported at 0 despite pain A/P: care everywhere states bipolar affective. Will need to update meds to reflect last psychiatry note by next visit.   BPH (benign prostatic hyperplasia) S: reasonable flow since being placed on flomax through urology A/P: continue current medications  Chronic back pain S:  Patient tells me of history of Back surgery x1 [place], fusion 2004-2010 Dr Dutch Quint.  Complains of 8-9/10 Low, middle, upper back pain. Not on any narcotic medications as of 2019 for back though he was on in the past. Lays in bed most of the day. Smokes marijuana and uses CBD oil to help with pain (advised against marijuana). Pain ranges 8-9/10 most of the day. Has been to numerous pain clinic- tests positive for morphine  when on opiods and then he is released. Had tried cocaine in past to see if felt normal but didn't and later showed up on drug screen and kicked out of another pain clinic (apparently he has been to several including going to [place]). Was taken off fiorcet and soma as a result of positive drug screen. Has had success with narcotics but with drug screens is off- he reports always urine shows morphine even if blood does not show morphine.   Headache clinic doctor with [facility]- Baclofen recent start- states hallucinates when going to sleep. Doesn't find very helpful but shaves 9 to an 8 so wants to take this.   From last CPE 10/2016- reviewed after visit "Prescriptions by me: Zetia, Fioricet, Soma, Imitrex (tabs and injections), Valtrex Prescription by his Cardiologist, Dr Wille GlaserWallmeyer: Coreg, Lisinopril, Lipitor, Effient - this was stopped August 2018, NTG Prescriptions by his Psychiatrist Dr Sandria ManlyLove: Cymbalta, Valium, Lamictal, Ritalin, Abilify Prescriptions by his Urologist Dr. Katrinka BlazingSmith: testosterone, Cialis Has not been going to a pain specialist in over a  year: Oxycodone is only from me for short courses when he goes on a trip.  We have discussed the multiple interactions and potential for harm, including death without "overdose", and he is unwilling to try lower doses or reduced frequency. He came to me already on this regimen many years ago, it has not been changed except for cutting back on the Soma from 4 a day to 3 a day. Initially he did receive opiates from me, then I insisted he see a pain specialist and he obtained the opiates from a Pain Specialist for a few years, was discharged form one practice (we questioned the reason behind this), he went to several different practices, but has not been in the care of a pain specialty clinic in some time. I have advised him to reduce the Valium and that this in conjunction with the Fioricet with codeine is a very dangerous combination. He complains and describes intolerable pain if anything is adjusted. Yet at other times he has gone for months without the medications.  I will eventually stop the Soma. This cannot be negotiable. He says if the Tresa GarterSoma is stopped he has increased back pain. Does not recall use of Zanaflex nor Norflex, does recall no response to Robaxin, nor Flexeril.  I will insist on a referral to a headache clinic. I later insist that he re-test for the OSA and fitting for CPAP. This may improve his headaches. Print out of the NCCSDB shows that he has not been getting oxycodone from his pain clinic, and has only received a few prescriptions from me prior to trips he has taken.  " A/P: I initially agreed to up to 2 weeks of soma QID per month. After reviewing chart and noting neurologist and prior PCP want to wean him off need to reconsider this approach in the future- will plan to take him off this at next visit.. In addition did not run NCCSRS today and will plan on doing that in future. We are going to see each other early April for perhaps 3 days of oxycodone. Since we are only using 2 week  bursts of soma (350mg  up to QID)- should not have to wean him off but be able to stop instead.    We had a long discussion at time of risks about SOMA  And how he should space this from other high risk medications.   Migraine Asks about fiorcet refill here- I advised with  him seeing neurology/headache specialist that refills should be provided through their office.   Visit in early April advised  Meds ordered this encounter  Medications  . carisoprodol (SOMA) 350 MG tablet    Sig: Take 1 tablet (350 mg total) by mouth 4 (four) times daily as needed for muscle spasms (may refill once per month).    Dispense:  56 tablet    Refill:  2  . amoxicillin-clavulanate (AUGMENTIN) 875-125 MG tablet    Sig: Take 1 tablet by mouth 2 (two) times daily for 7 days.    Dispense:  14 tablet    Refill:  0   Time Stamp The duration of face-to-face time during this visit was greater than 46 minutes (8 15 to 9 01 AM). Greater than 50% of this time was spent in counseling, explanation of diagnosis, planning of further management, and/or coordination of care including discussion of high risk medications, risks to his health (he denies any sedative issues with medicines despite fact he is also on ritalin for sedation- thought about this contraindication after visit), options including pain control and neurosurgery which he declined at visit.   Return precautions advised. Tana Conch, MD

## 2017-05-08 NOTE — Patient Instructions (Addendum)
Sign release of information at the check out desk for last colonoscopy  Tresa GarterSoma is a high risk medication but I understand how difficult your situation is. Per uptodate should be used 2-3 weeks maximum. I have given you enough to take 2 weeks total per month- perhaps if you have flares but using it sparingly will make this less risky for you.   You should space this medicine by at least 6 hours from fiorcet, valium, ambien. Taking with baclofen can cause severe sedation as well.   Lets see each other back early April- consider 3 days of percocet to help you get through wedding  Obviously ideally should not be using marijuana with any of this

## 2017-05-10 ENCOUNTER — Other Ambulatory Visit: Payer: Self-pay

## 2017-05-10 ENCOUNTER — Telehealth: Payer: Self-pay

## 2017-05-10 NOTE — Telephone Encounter (Signed)
Thanks Jamie!

## 2017-05-10 NOTE — Telephone Encounter (Signed)
Called and spoke with patient who verbalized understanding. He states his ear is feeling better.

## 2017-05-10 NOTE — Telephone Encounter (Signed)
-----   Message from Shelva MajesticStephen O Hunter, MD sent at 05/08/2017  6:01 PM EST ----- Please inform patient after review of chart after visit- it appears prior PCP and neurologist both wanted to get him off of the SOMA. I do not want to go against their recommendations. I would suggest he find a pain specialist or as per neurologist perhaps neurosurgeon to see if anything can be done- perhaps back to Dr. Dutch QuintPoole?   I still am willing to do the 3 days of percocet before wedding but my plan would be to discontinue SOMA and future narcotics at time of next visit.   Hope he is feeling better from ear infectoin  Tana ConchStephen Hunter

## 2017-05-15 ENCOUNTER — Telehealth: Payer: Self-pay | Admitting: *Deleted

## 2017-05-15 NOTE — Telephone Encounter (Signed)
Copied from CRM [id number]. Topic: Inquiry >> May 15, 2017 11:48 AM Alexander BergeronBarksdale, Harvey B wrote: Reason for CRM: pt called and states the antibiotics has improved the cough he has over the past week but still having sinus issues, not improving as much, he would like to be contacted on whether he could get more medication or if he needs to be seen, contact pt

## 2017-05-16 NOTE — Telephone Encounter (Signed)
Lets see him back- think we should recheck ears and also discuss course as may need different or prolonged antibiotic.

## 2017-05-17 ENCOUNTER — Ambulatory Visit (INDEPENDENT_AMBULATORY_CARE_PROVIDER_SITE_OTHER): Payer: Managed Care, Other (non HMO) | Admitting: Family Medicine

## 2017-05-17 ENCOUNTER — Encounter: Payer: Self-pay | Admitting: Family Medicine

## 2017-05-17 VITALS — BP 108/78 | HR 80 | Temp 98.0°F | Ht 72.0 in | Wt 192.8 lb

## 2017-05-17 DIAGNOSIS — I2510 Atherosclerotic heart disease of native coronary artery without angina pectoris: Secondary | ICD-10-CM

## 2017-05-17 DIAGNOSIS — J329 Chronic sinusitis, unspecified: Secondary | ICD-10-CM | POA: Diagnosis not present

## 2017-05-17 DIAGNOSIS — B9689 Other specified bacterial agents as the cause of diseases classified elsewhere: Secondary | ICD-10-CM

## 2017-05-17 MED ORDER — METHYLPREDNISOLONE ACETATE 40 MG/ML IJ SUSP
40.0000 mg | Freq: Once | INTRAMUSCULAR | Status: AC
  Administered 2017-05-17: 40 mg via INTRAMUSCULAR

## 2017-05-17 MED ORDER — AMOXICILLIN-POT CLAVULANATE 875-125 MG PO TABS: 1.0000 | ORAL_TABLET | Freq: Two times a day (BID) | ORAL | 0 refills | Status: DC

## 2017-05-17 NOTE — Patient Instructions (Addendum)
Lets extend the augmentin another 7-10 days. Can stop at 7 days if fully resolved.   Also steroid injection today  See us back if fail to improve with this regimen  See us back for fever, chills, worsening sinus pressure, failure to resolve, shortness of breath

## 2017-05-17 NOTE — Telephone Encounter (Signed)
Called and left a voicemail message asking patient to call and schedule an appointment for this afternoon

## 2017-05-17 NOTE — Progress Notes (Signed)
Subjective:  Charles Gross is a 57 y.o. year old very pleasant male patient who presents for/with See problem oriented charting ROS- no fever or chills. Right ear pain much improved. Sinus congestin has worsened. No chest pain. Continued back pain   Past Medical History-  Patient Active Problem List   Diagnosis Date Noted  . Depression, major, single episode, complete remission (HCC) 05/08/2017    Priority: High  . CAD (coronary artery disease)     Priority: High  . Chronic back pain     Priority: High  . BPH (benign prostatic hyperplasia) 05/08/2017    Priority: Medium  . Migraine     Priority: Medium  . Hypogonadism in male 05/08/2017    Priority: Low  . Recurrent cold sores 05/08/2017    Priority: Low  . Former smoker 05/08/2017    Priority: Low    Medications- reviewed and updated Current Outpatient Medications  Medication Sig Dispense Refill  . ARIPiprazole (ABILIFY) 15 MG tablet Take 15 mg by mouth daily.    Marland Kitchen. aspirin 81 MG tablet Take 81 mg by mouth daily.      . baclofen (LIORESAL) 10 MG tablet TK 1-2 TS PO Q 6-8 H UP TO TID PRF PAIN  2  . butalbital-acetaminophen-caffeine (FIORICET/CODEINE) 50-325-40-30 MG per capsule Take 2 capsules by mouth every 4 (four) hours as needed. headaches    . carisoprodol (SOMA) 350 MG tablet Take 1 tablet (350 mg total) by mouth 4 (four) times daily as needed for muscle spasms (may refill once per month). 56 tablet 2  . carvedilol (COREG) 3.125 MG tablet Take 3.125 mg by mouth 2 (two) times daily with a meal.      . diazepam (VALIUM) 10 MG tablet Take 1 tablet by mouth daily as needed.    . DULoxetine (CYMBALTA) 60 MG capsule Take 60 mg by mouth 2 (two) times daily.    Marland Kitchen. ezetimibe (ZETIA) 10 MG tablet Take 1 tablet by mouth daily.  0  . lamoTRIgine (LAMICTAL) 200 MG tablet Take 200 mg by mouth 2 (two) times daily.    . methylphenidate (RITALIN) 20 MG tablet Take 2 in the morning, 1 at noon, 1 at 3pm    . nitroGLYCERIN (NITROSTAT) 0.4  MG SL tablet PLACE 1 TABLET UNDER THE TONGUE CONTINUOUS AS NEEDED    . SUMAtriptan (IMITREX) 6 MG/0.5ML SOLN injection Inject 6 mg into the skin daily. For headaches if needed     . Testosterone 20.25 MG/ACT (1.62%) GEL Apply 1 Pump topically daily.  2  . valACYclovir (VALTREX) 1000 MG tablet Take 1 tablet by mouth 3 (three) times daily.    Marland Kitchen. amoxicillin-clavulanate (AUGMENTIN) 875-125 MG tablet Take 1 tablet by mouth 2 (two) times daily. 20 tablet 0   No current facility-administered medications for this visit.     Objective: BP 108/78 (BP Location: Left Arm, Patient Position: Sitting, Cuff Size: Large)   Pulse 80   Temp 98 F (36.7 C) (Oral)   Ht 6' (1.829 m)   Wt 192 lb 12.8 oz (87.5 kg)   SpO2 95%   BMI 26.15 kg/m  Gen: NAD, resting comfortably Nasal turbinates erythematous with clear and yellow discharge, TM normal (improved from last week), pharynx mild erythema/drainage CV: RRR no murmurs rubs or gallops Lungs: CTAB no crackles, wheeze, rhonchi Abdomen: soft/nontender/nondistended/normal bowel sounds.  Ext: no edema Skin: warm, dry   Assessment/Plan:   Bacterial sinusitis - Plan: methylPREDNISolone acetate (DEPO-MEDROL) injection 40 mg S: patient seen about  10 days ago and had 2 weeks of symptoms including cough, right ear pressure. At beginning of illness had body aches and chills. At time of last visit had lingering right ear pain and cough.  He also had some rhonchi in lung bases- discussed may be bronchitis and 4-6 weeks of symptoms. But also treated with augmentin for 7 days for right otitis media.   From my note last visit- I had written patient didn't have sinus congestion. He states he underreported the congestion- and that it improved on augmentin. Thinks he got 75% better. Cough has been much better. Since that time- worsening nasal congestion and frontal headaches (usually his headaches are in sides or back). Ears feel better overall but some pressure. Mouth  breathing last night due to congestion. Mainly clear discharge.  Steroid injections in the past without side effects.  A/P: appears also may have had sinus infection at last visit which did not fully resolve with augmentin 7 days "Lets extend the augmentin another 7-10 days. Can stop at 7 days if fully resolved.   Also steroid injection today  See [place] back if fail to improve with this regimen  See [place] back for fever, chills, worsening sinus pressure, failure to resolve, shortness of breath"  Lab/Order associations: Bacterial sinusitis - Plan: methylPREDNISolone acetate (DEPO-MEDROL) injection 40 mg  Meds ordered this encounter  Medications  . amoxicillin-clavulanate (AUGMENTIN) 875-125 MG tablet    Sig: Take 1 tablet by mouth 2 (two) times daily.    Dispense:  20 tablet    Refill:  0  . methylPREDNISolone acetate (DEPO-MEDROL) injection 40 mg    Return precautions advised.  Tana Conch, MD

## 2017-05-24 ENCOUNTER — Ambulatory Visit: Payer: Self-pay

## 2017-05-24 NOTE — Telephone Encounter (Signed)
See note

## 2017-05-24 NOTE — Telephone Encounter (Signed)
Pt. Reports he is "still sick - ears popping,painful,sore throat,sinus pressure." States "I just can't get over this." Request Prednisone that Dr. Durene CalHunter offered be [facility] [address] [place].

## 2017-05-25 NOTE — Telephone Encounter (Signed)
Can send in prednisone 50mg  x 7 days for daily use. He needs to see us or ENT if not improving with this.

## 2017-05-27 ENCOUNTER — Other Ambulatory Visit: Payer: Self-pay

## 2017-05-27 ENCOUNTER — Telehealth: Payer: Self-pay | Admitting: Family Medicine

## 2017-05-27 MED ORDER — PREDNISONE 50 MG PO TABS: ORAL_TABLET | ORAL | 0 refills | Status: DC

## 2017-05-27 NOTE — Telephone Encounter (Signed)
Prescription sent to pharmacy as requested and patient is aware

## 2017-05-27 NOTE — Telephone Encounter (Signed)
Copied from CRM [phone]. Topic: Quick Communication - See Telephone Encounter >> May 27, 2017 10:24 AM Arlyss Gandyichardson, Terrye Dombrosky N, NT wrote: CRM for notification. See Telephone encounter for: Pt calling back to see why the prednisone was not called into [facility] on [address] in [place] on Friday? Please contact pt.   05/27/17.

## 2017-05-27 NOTE — Telephone Encounter (Signed)
Prescription sent to pharmacy as Dr. Durene CalHunter requested. Called patient and let him know. He verbalized understanding

## 2017-06-13 ENCOUNTER — Encounter: Payer: Self-pay | Admitting: Family Medicine

## 2017-06-13 ENCOUNTER — Ambulatory Visit (INDEPENDENT_AMBULATORY_CARE_PROVIDER_SITE_OTHER): Payer: Managed Care, Other (non HMO) | Admitting: Family Medicine

## 2017-06-13 VITALS — BP 124/78 | HR 90 | Temp 98.2°F | Ht 72.0 in | Wt 190.4 lb

## 2017-06-13 DIAGNOSIS — G43809 Other migraine, not intractable, without status migrainosus: Secondary | ICD-10-CM | POA: Diagnosis not present

## 2017-06-13 MED ORDER — BUTALBITAL-APAP-CAFF-COD 50-325-40-30 MG PO CAPS: 2.0000 | ORAL_CAPSULE | ORAL | 0 refills | Status: DC | PRN

## 2017-06-13 NOTE — Progress Notes (Signed)
    Subjective:  Charles Gross is a 57 y.o. male who presents today for same-day appointment with a chief complaint of headaches.   HPI:  Headaches, established problem, stable Patient establish care with his PCP in this office about 1 month ago.  At that time, patient requested refill for his Fioricet.  PCP then advised him to follow-up with a headache specialist to discuss this further.  Patient saw a headache specialist about 2 weeks ago however was unhappy with his care.  He was prescribed about 5 days worth of Fioricet.  Patient reports that he will not be going back to that particular headache specialist.  Currently has a mild headache, and requests refill for Fioricet today.  He is not interested in a referral to a headache specialist in [place] today.   ROS: Per HPI  PMH: He reports that he quit smoking about 9 years ago. He has a 2.50 pack-year smoking history. He has never used smokeless tobacco. He reports that he drinks alcohol. He reports that he has current or past drug history. Drug: Marijuana.  Objective:  Physical Exam: BP 124/78 (BP Location: Left Arm, Patient Position: Sitting, Cuff Size: Normal)   Pulse 90   Temp 98.2 F (36.8 C) (Oral)   Ht 6' (1.829 m)   Wt 190 lb 6.4 oz (86.4 kg)   SpO2 98%   BMI 25.82 kg/m   Gen: NAD, resting comfortably CV: RRR with no murmurs appreciated Pulm: NWOB, CTAB with no crackles, wheezes, or rhonchi MSK: No edema, cyanosis, or clubbing noted Skin: Warm, dry Neuro: Cranial nerves III through XII intact.  Moves all extremities spontaneously. Psych: Normal affect and thought content  Assessment/Plan:  Headaches No red flag signs or symptoms today.  This is a chronic problem for patient.  He requests refill for Fioricet today.  I reviewed his narcotic database which was consistent with the history provided.  Advised patient that since this was a narcotic, I would only be able to give 5 days worth of this medication that all  further refills should come from his specialist or his PCP.  Patient voiced understanding.  Time Spent: I spent 16 minutes face-to-face with the patient, with more than half spent on counseling for appropriate management for his headaches as well as risk associated with chronic narcotic use.  Katina Degreealeb M. Jimmey RalphParker, MD 06/13/2017 3:34 PM

## 2017-06-13 NOTE — Patient Instructions (Signed)
I will refill your fioricet for 5 days.  Please talk with your primary physician regarding long term plan for your headaches.  Take care, Dr Jimmey RalphParker

## 2017-06-18 ENCOUNTER — Other Ambulatory Visit: Payer: Self-pay | Admitting: Family Medicine

## 2017-06-18 ENCOUNTER — Encounter: Payer: Self-pay | Admitting: Family Medicine

## 2017-06-18 ENCOUNTER — Ambulatory Visit: Payer: Managed Care, Other (non HMO) | Admitting: Family Medicine

## 2017-06-18 VITALS — BP 116/88 | HR 77 | Temp 97.8°F | Ht 72.0 in | Wt 192.0 lb

## 2017-06-18 DIAGNOSIS — M545 Low back pain: Secondary | ICD-10-CM | POA: Diagnosis not present

## 2017-06-18 DIAGNOSIS — G43809 Other migraine, not intractable, without status migrainosus: Secondary | ICD-10-CM

## 2017-06-18 DIAGNOSIS — G8929 Other chronic pain: Secondary | ICD-10-CM

## 2017-06-18 DIAGNOSIS — Z79899 Other long term (current) drug therapy: Secondary | ICD-10-CM

## 2017-06-18 MED ORDER — BUTALBITAL-APAP-CAFF-COD 50-325-40-30 MG PO CAPS: 1.0000 | ORAL_CAPSULE | ORAL | 0 refills | Status: DC | PRN

## 2017-06-18 MED ORDER — IPRATROPIUM BROMIDE 0.06 % NA SOLN: 2.0000 | Freq: Four times a day (QID) | NASAL | 0 refills | Status: DC

## 2017-06-18 MED ORDER — OXYCODONE-ACETAMINOPHEN 10-325 MG PO TABS: 1.0000 | ORAL_TABLET | Freq: Three times a day (TID) | ORAL | 0 refills | Status: DC | PRN

## 2017-06-18 NOTE — Patient Instructions (Addendum)
May use fiorcet - up to 1 tablet twice a day. With prior prescriptions- you should have enough to get you to visit with neurology  If neurology clearly states they are ok with me prescribing this as part of your headache regimen (I am concerned about rebound headaches) then I would be willing to prescribe twice a day for up to 6 months (would need follow up appointment to get this started and start controlled substance contract).   UDS today before you leave- we have water bottles if you need extra hydration to pee. Any concerns outside of barbiturates and narcotics- we will no longer prescribe any scheduled substance like fiorcet or percocet.   Try atrovent nasal spray for congestion  I did write #9 pills of percocet to help you through wedding weekend  There should be no more prescriptions from any other providers outside of me unless I clearly state that another provider can provide a prescription. FOr now- ok with diazepam and methylpenhidate through psychiatry. Testosterone through urology. I am willing to refer you to pain management if you would like or restart from ortho/neurosurgery route if needed.   Soma removed from med list

## 2017-06-18 NOTE — Assessment & Plan Note (Signed)
S: Patient comes to me today stating he does not want to continue with [facility] ideally- his main concern is being in pain for 6 months as he only uses fiorcet with codeine 1-2x a week as he has been using 2 tablets twice a day for some time through prior PCP. This likely helps his back some as well. Patient had been referred to headache clinic and I explained to patient rationale that such a regimen should only be done in coordination with a specialist given the atypical regimen. We also discussed CAD contraindication to sumatriptan which we did not get a chance to discuss last visit.   The fiorcet with codeine he calls a miracle drug as well as the sumatriptan for back up when it sometimes does not work.  A/P: strongly encouraged him to return to the headache clinic with [facility]. I told him the note and thought process by his provider was excellent and it makes sense to trial the botox. He is very happy with his "control" but I told him bluntly that taking 4 fiorcet with codeine on a daily basis and still needing sumatriptan 2-3 x a week shows very poor control and puts him at risk for rebound headaches so we need to begin to switch plans and trialing the botox  We had an extended discussion about the risks/benefits of medicines particularly in his situation. We went over his NCCSRS overdose risk score which is one of the highest I have seen in my clinic. The 14 prescribers listed I also told him strongly concerned me- he agrees to consolidate all high risk medications under my care and if specialists do prescribe that he will have my permission.   Ultimately I agreed to 1 tablet of fiorcet #3 twice a day #14- and considering since 05/09/17 he has received 160 tablets- that should be adequate to get him to his appointment with neurology. I told him I would prescribe this over the next 6 months if neurology thought this was a reasonable plan as the botox is given a chance to work. If they do not  approve this I will stop it- also discussed if UDS positive for anything other than what he is prescribed that I would stop prescribing medications.   He will need to come back for visit to do controlled substance contract if I am to prescribe over the next 6 months. He should not take percocet along with fiorcet with codeine. Percocet #9 is only for upcoming daughter for one of his children.

## 2017-06-18 NOTE — Assessment & Plan Note (Signed)
S: We discussed stopping soma as prior pcp and neurologist advised- he is in agreement. He has an upcoming wedding for one of is children and I previously told him we could use percocet for 3 days total but no more.  A/P: UDS today- I had advised against marijuana in past but forgot to ask if he was using at present. Percocet 10/325 #9 for 3 days of the wedding festivities total. I also discussed with him referring to pain clinid or going back to neurosurgery- he declines for now. Will need pain contract her for fiorcet with codeine if we prescribe- I would want clean urine drug screen other than medicines he is on before starting this regimen.

## 2017-06-18 NOTE — Progress Notes (Signed)
Subjective:  Charles Gross is a 57 y.o. year old very pleasant male patient who presents for/with See problem oriented charting ROS- continued low back pain as well as daily headaches which improve on fiorcet. No recent chest pain or shortness of breath reported.    Past Medical History-  Patient Active Problem List   Diagnosis Date Noted  . Depression, major, single episode, complete remission (HCC) 05/08/2017    Priority: High  . CAD (coronary artery disease)     Priority: High  . Chronic back pain     Priority: High  . BPH (benign prostatic hyperplasia) 05/08/2017    Priority: Medium  . Migraine     Priority: Medium  . Hypogonadism in male 05/08/2017    Priority: Low  . Recurrent cold sores 05/08/2017    Priority: Low  . Former smoker 05/08/2017    Priority: Low    Medications- reviewed and updated Current Outpatient Medications  Medication Sig Dispense Refill  . ARIPiprazole (ABILIFY) 15 MG tablet Take 15 mg by mouth daily.    Marland Kitchen. aspirin 81 MG tablet Take 81 mg by mouth daily.      . butalbital-acetaminophen-caffeine (FIORICET/CODEINE) 50-325-40-30 MG capsule Take 2 capsules by mouth every 4 (four) hours as needed. headaches 20 capsule 0  . carisoprodol (SOMA) 350 MG tablet Take 1 tablet (350 mg total) by mouth 4 (four) times daily as needed for muscle spasms (may refill once per month). 56 tablet 2  . carvedilol (COREG) 3.125 MG tablet Take 3.125 mg by mouth 2 (two) times daily with a meal.      . diazepam (VALIUM) 10 MG tablet Take 1 tablet by mouth daily as needed.    . DULoxetine (CYMBALTA) 60 MG capsule Take 60 mg by mouth 2 (two) times daily.    Marland Kitchen. ezetimibe (ZETIA) 10 MG tablet Take 1 tablet by mouth daily.  0  . lamoTRIgine (LAMICTAL) 200 MG tablet Take 200 mg by mouth 2 (two) times daily.    . methylphenidate (RITALIN) 20 MG tablet Take 2 in the morning, 1 at noon, 1 at 3pm    . nitroGLYCERIN (NITROSTAT) 0.4 MG SL tablet PLACE 1 TABLET UNDER THE TONGUE CONTINUOUS  AS NEEDED    . predniSONE (DELTASONE) 50 MG tablet Take one tablet by mouth daily. See your PCP or ENT if not improving with this 7 tablet 0  . SUMAtriptan (IMITREX) 6 MG/0.5ML SOLN injection Inject 6 mg into the skin daily. For headaches if needed     . Testosterone 20.25 MG/ACT (1.62%) GEL Apply 1 Pump topically daily.  2  . valACYclovir (VALTREX) 1000 MG tablet Take 1 tablet by mouth 3 (three) times daily.     No current facility-administered medications for this visit.     Objective: BP 116/88 (BP Location: Left Arm, Patient Position: Sitting, Cuff Size: Large)   Pulse 77   Temp 97.8 F (36.6 C) (Oral)   Ht 6' (1.829 m)   Wt 192 lb (87.1 kg)   SpO2 98%   BMI 26.04 kg/m  Gen: NAD, resting comfortably Some erythema of nasal turbinates. Light clear drainage. Tm normal. Oropharynx largely normal CV: RRR no murmurs rubs or gallops Lungs: CTAB no crackles, wheeze, rhonchi Ext: no edema Skin: warm, dry Neuro:  Stands during visit due to back discomfort  Assessment/Plan:  2-3 days of congestion. hasnt had allergies in year. No sneeze. We discussed trial of atrovent. With only congestion- doubt allergies but may have to try flonase if  this is not effective.   Migraine S: Patient comes to me today stating he does not want to continue with [facility] ideally- his main concern is being in pain for 6 months as he only uses fiorcet with codeine 1-2x a week as he has been using 2 tablets twice a day for some time through prior PCP. This likely helps his back some as well. Patient had been referred to headache clinic and I explained to patient rationale that such a regimen should only be done in coordination with a specialist given the atypical regimen. We also discussed CAD contraindication to sumatriptan which we did not get a chance to discuss last visit.   The fiorcet with codeine he calls a miracle drug as well as the sumatriptan for back up when it sometimes does not work.   A/P: strongly encouraged him to return to the headache clinic with [facility]. I told him the note and thought process by his provider was excellent and it makes sense to trial the botox. He is very happy with his "control" but I told him bluntly that taking 4 fiorcet with codeine on a daily basis and still needing sumatriptan 2-3 x a week shows very poor control and puts him at risk for rebound headaches so we need to begin to switch plans and trialing the botox  We had an extended discussion about the risks/benefits of medicines particularly in his situation. We went over his NCCSRS overdose risk score which is one of the highest I have seen in my clinic. The 14 prescribers listed I also told him strongly concerned me- he agrees to consolidate all high risk medications under my care and if specialists do prescribe that he will have my permission.   Ultimately I agreed to 1 tablet of fiorcet #3 twice a day #14- and considering since 05/09/17 he has received 160 tablets- that should be adequate to get him to his appointment with neurology. I told him I would prescribe this over the next 6 months if neurology thought this was a reasonable plan as the botox is given a chance to work. If they do not approve this I will stop it- also discussed if UDS positive for anything other than what he is prescribed that I would stop prescribing medications.   He will need to come back for visit to do controlled substance contract if I am to prescribe over the next 6 months. He should not take percocet along with fiorcet with codeine. Percocet #9 is only for upcoming daughter for one of his children.    Chronic back pain S: We discussed stopping soma as prior pcp and neurologist advised- he is in agreement. He has an upcoming wedding for one of is children and I previously told him we could use percocet for 3 days total but no more.  A/P: UDS today- I had advised against marijuana in past but forgot to ask if he was using  at present. Percocet 10/325 #9 for 3 days of the wedding festivities total. I also discussed with him referring to pain clinid or going back to neurosurgery- he declines for now. Will need pain contract her for fiorcet with codeine if we prescribe- I would want clean urine drug screen other than medicines he is on before starting this regimen.   Lab/Order associations: High risk medication use - Plan: Pain Mgmt, Profile 8 w/Conf, U   Meds ordered this encounter  Medications  . butalbital-acetaminophen-caffeine (FIORICET/CODEINE) 50-325-40-30 MG capsule  Sig: Take 1 capsule by mouth every 4 (four) hours as needed. headaches    Dispense:  14 capsule    Refill:  0  . oxyCODONE-acetaminophen (PERCOCET) 10-325 MG tablet    Sig: Take 1 tablet by mouth every 8 (eight) hours as needed for pain (during daughters wedding weekend. Do not use with fiorcet.).    Dispense:  9 tablet    Refill:  0  . DISCONTD: ipratropium (ATROVENT) 0.06 % nasal spray    Sig: Place 2 sprays into both nostrils 4 (four) times daily.    Dispense:  15 mL    Refill:  0   Time Stamp The duration of face-to-face time during this visit was greater than 40 minutes (12:15 PM to 1 PM). Greater than 50% of this time was spent in counseling, explanation of diagnosis, planning of further management, and/or coordination of care including discussing risks of the high risk medicines he is on, my concerns with way he has navigated care, my concerns for his health with current regimen.     Return precautions advised.  Tana Conch, MD

## 2017-06-22 LAB — PAIN MGMT, PROFILE 8 W/CONF, U
6 Acetylmorphine: NEGATIVE ng/mL (ref <10)
ALCOHOL METABOLITES: POSITIVE ng/mL — AB (ref <500)
AMINOCLONAZEPAM: NEGATIVE ng/mL (ref <25)
Alphahydroxyalprazolam: NEGATIVE ng/mL (ref <25)
Alphahydroxymidazolam: NEGATIVE ng/mL (ref <50)
Alphahydroxytriazolam: NEGATIVE ng/mL (ref <50)
Amphetamines: NEGATIVE ng/mL (ref <500)
BUPRENORPHINE, URINE: NEGATIVE ng/mL (ref <5)
BUPRENORPHINE: NEGATIVE ng/mL (ref <2)
Benzodiazepines: POSITIVE ng/mL — AB (ref <100)
Cocaine Metabolite: NEGATIVE ng/mL (ref <150)
Codeine: >20000 ng/mL — ABNORMAL HIGH (ref <50)
Creatinine: 83.4 mg/dL
ETHYL GLUCURONIDE (ETG): 3324 ng/mL — AB (ref <500)
ETHYL SULFATE (ETS): 752 ng/mL — AB (ref <100)
HYDROCODONE: NEGATIVE ng/mL (ref <50)
HYDROMORPHONE: NEGATIVE ng/mL (ref <50)
HYDROXYETHYLFLURAZEPAM: NEGATIVE ng/mL (ref <50)
Lorazepam: NEGATIVE ng/mL (ref <50)
MARIJUANA METABOLITE: 145 ng/mL — AB (ref <5)
MARIJUANA METABOLITE: POSITIVE ng/mL — AB (ref <20)
MDMA: NEGATIVE ng/mL (ref <500)
MORPHINE: 1948 ng/mL — AB (ref <50)
Norbuprenorphine: NEGATIVE ng/mL (ref <2)
Nordiazepam: 177 ng/mL — ABNORMAL HIGH (ref <50)
Norhydrocodone: 85 ng/mL — ABNORMAL HIGH (ref <50)
OPIATES: POSITIVE ng/mL — AB (ref <100)
Oxazepam: 214 ng/mL — ABNORMAL HIGH (ref <50)
Oxidant: NEGATIVE ug/mL (ref <200)
Oxycodone: NEGATIVE ng/mL (ref <100)
TEMAZEPAM: 288 ng/mL — AB (ref <50)
pH: 6.48 (ref 4.5–9.0)

## 2017-07-29 ENCOUNTER — Telehealth: Payer: Self-pay | Admitting: *Deleted

## 2017-07-29 NOTE — Telephone Encounter (Signed)
Fax from [facility]: Caller states he had acid reflux last night. He was vomiting x4 last night and had a black look to it, and has diarrhea now as well- he is drinking but still throws things up. [facility] advised him to see physician within 4 hours.   I called patient and he states he went to urgent care and they suggested that it was either food poisoning or a virus and prescribed medication to help with symptoms. He states that that he is feeling better. I advised him to call the office with any changes.

## 2017-07-29 NOTE — Telephone Encounter (Signed)
Noted  

## 2017-08-01 ENCOUNTER — Other Ambulatory Visit: Payer: Self-pay | Admitting: Family Medicine

## 2017-08-30 ENCOUNTER — Ambulatory Visit: Payer: Managed Care, Other (non HMO) | Admitting: Family Medicine

## 2017-08-30 ENCOUNTER — Encounter: Payer: Self-pay | Admitting: Family Medicine

## 2017-08-30 VITALS — BP 122/84 | HR 82 | Temp 98.3°F | Ht 72.0 in | Wt 199.4 lb

## 2017-08-30 DIAGNOSIS — I2510 Atherosclerotic heart disease of native coronary artery without angina pectoris: Secondary | ICD-10-CM | POA: Diagnosis not present

## 2017-08-30 DIAGNOSIS — M545 Low back pain: Secondary | ICD-10-CM

## 2017-08-30 DIAGNOSIS — G8929 Other chronic pain: Secondary | ICD-10-CM

## 2017-08-30 DIAGNOSIS — G43809 Other migraine, not intractable, without status migrainosus: Secondary | ICD-10-CM

## 2017-08-30 MED ORDER — OXYCODONE-ACETAMINOPHEN 5-325 MG PO TABS: 1.0000 | ORAL_TABLET | Freq: Three times a day (TID) | ORAL | 0 refills | Status: AC | PRN

## 2017-08-30 NOTE — Patient Instructions (Addendum)
Health Maintenance Due  Topic Date Due  . Hepatitis C Screening - Patient Declined [birth year]  . HIV Screening - Patient Declined 01/14/1976  . COLONOSCOPY - Patient will get the information to our office so we can request the records. 01/14/2013   We gave a 5-day supply of Percocet 5/325 today for your trip to [place].  We agreed this would be the last narcotic prescription until we have a negative drug screen for marijuana.  I really want you to go ahead and ask your cardiologist about sumatriptan and alcohol specifically.  I do not think you need glass of wine each evening.  I also would recommend against the sumatriptan being used until you have cleared this with cardiology- obviously I am very happy that you have not had any adverse effects to this point on the medicine.   I would recommend you schedule a physical 1 year out from your last physical with [facility]  Have a great summer!

## 2017-08-30 NOTE — Assessment & Plan Note (Addendum)
S: Patient with continued back pain.  He continues to smoke marijuana and use CBD oil.  He states he lays in the bed most of the day. Back surgery x1 [place] 2002, fusion 2004-2010 Dr Dutch QuintPoole.  He reports pain throughout his back and particularly in the low back.  We did a few days of pain medication with Percocet 10/325 for his daughter's wedding-wedding went well- didn't have to lay down at all.  He does not use regular pain medication-Taking 55 year old father next Wednesday- going to [place] first then driving to [place]. Plane rides/car is tough for him. Requests 5 day supply.   Soma helps but no one will write it. Flexeril doesn't help as much but he has that available A/P: I told patient I remain very concerned about the high risk medications he is on.  Luckily he is no longer on Fioricet with codeine or Soma.  He has Valium on hand from his psychiatrist but uses very rarely.  He is on Ritalin per psychiatrist as well.  I reviewed NCCSRS and his last soma prescription was from me before we stop this medication, his last oxycodone prescription was from me for daughter's wedding.  In 3 months he is not refilled Fioricet with codeine.  I told him I am going to use sparing Percocet that he can no longer use marijuana after today.  I did provide #15 of Percocet 5/325 but this will be the last time until we have already seen a negative urine drug screen.  I also advised him against drinking wine given his high risk medication use even though he only drinks 1 glass a day.

## 2017-08-30 NOTE — Assessment & Plan Note (Signed)
S:Off fiorcet and soma. botox has helped tremendously.  He is still receiving sumatriptan from his prior [facility] primary care physician A/P: I told patient I was very concerned about the sumatriptan.  I specifically asked him to ask his cardiologist about both this and the reported wine that he drinks per night one glass for heart health  I did not discuss this with patient today but I think it is less than ideal that he sees me for pain medication and a different primary care doctor for his sumatriptan-ideally we would consolidate care-with that being said I am not willing to prescribe sumatriptan until it is explicitly stated by his cardiologist that this is safe.

## 2017-08-30 NOTE — Assessment & Plan Note (Addendum)
S: Patient continues to follow up with [place] cardiologist. He remains on asa, statin. No recent chest pain or shortness of rbeath A/P: Continue cardiology follow-up-once again asked him specifically to ask his cardiologist about sumatriptan use and 1 glass of wine a night-I would prefer he drinks no wine given high risk medication uses  We need to verify statin dose at next visit- not currently on med list.

## 2017-08-30 NOTE — Progress Notes (Signed)
Subjective:  Charles Gross is a 57 y.o. year old very pleasant male patient who presents for/with See problem oriented charting ROS-denies chest pain or shortness of breath.  Reports continued low back pain.  Denies depressed mood.  Headache pattern much improved.  Past Medical History-  Patient Active Problem List   Diagnosis Date Noted  . Depression, major, single episode, complete remission (HCC) 05/08/2017    Priority: High  . CAD (coronary artery disease)     Priority: High  . Chronic back pain     Priority: High  . BPH (benign prostatic hyperplasia) 05/08/2017    Priority: Medium  . Migraine     Priority: Medium  . Hypogonadism in male 05/08/2017    Priority: Low  . Recurrent cold sores 05/08/2017    Priority: Low  . Former smoker 05/08/2017    Priority: Low    Medications- reviewed and updated Current Outpatient Medications  Medication Sig Dispense Refill  . cyclobenzaprine (FLEXERIL) 10 MG tablet Take 10 mg by mouth 3 (three) times daily as needed for muscle spasms.    . ARIPiprazole (ABILIFY) 15 MG tablet Take 15 mg by mouth daily.    Marland Kitchen aspirin 81 MG tablet Take 81 mg by mouth daily.      . carvedilol (COREG) 3.125 MG tablet Take 3.125 mg by mouth 2 (two) times daily with a meal.      . diazepam (VALIUM) 10 MG tablet Take 1 tablet by mouth daily as needed.    . DULoxetine (CYMBALTA) 60 MG capsule Take 60 mg by mouth 2 (two) times daily.    Marland Kitchen ezetimibe (ZETIA) 10 MG tablet Take 1 tablet by mouth daily.  0  . ipratropium (ATROVENT) 0.06 % nasal spray USE 2 SPRAYS IN EACH NOSTRIL FOUR TIMES DAILY 135 mL 0  . lamoTRIgine (LAMICTAL) 200 MG tablet Take 200 mg by mouth 2 (two) times daily.    . methylphenidate (RITALIN) 20 MG tablet Take 2 in the morning, 1 at noon, 1 at 3pm    . nitroGLYCERIN (NITROSTAT) 0.4 MG SL tablet PLACE 1 TABLET UNDER THE TONGUE CONTINUOUS AS NEEDED    . SUMAtriptan (IMITREX) 6 MG/0.5ML SOLN injection Inject 6 mg into the skin daily. For headaches  if needed     . Testosterone 20.25 MG/ACT (1.62%) GEL Apply 1 Pump topically daily.  2  . valACYclovir (VALTREX) 1000 MG tablet Take 1 tablet by mouth 3 (three) times daily.     No current facility-administered medications for this visit.     Objective: BP 122/84 (BP Location: Left Arm, Patient Position: Sitting, Cuff Size: Normal)   Pulse 82   Temp 98.3 F (36.8 C) (Oral)   Ht 6' (1.829 m)   Wt 199 lb 6.4 oz (90.4 kg)   SpO2 92%   BMI 27.04 kg/m  Gen: NAD, resting comfortably CV: RRR no murmurs rubs or gallops Lungs: CTAB no crackles, wheeze, rhonchi Abdomen: soft/nontender/nondistended/overweight.  Ext: no edema Skin: warm, dry MSK: good flexibility in lumbar spine- able to touch toes while standing. Has pain with palpation over lumbar spine though not midline  Assessment/Plan:  Chronic back pain S: Patient with continued back pain.  He continues to smoke marijuana and use CBD oil.  He states he lays in the bed most of the day. Back surgery x1 [place], fusion 2004-2010 Dr Dutch Quint.  He reports pain throughout his back and particularly in the low back.  We did a few days of pain medication with  Percocet 10/325 for his daughter's wedding-wedding went well- didn't have to lay down at all.  He does not use regular pain medication-Taking 55 year old father next Wednesday- going to [place] first then driving to [place]. Plane rides/car is tough for him. Requests 5 day supply.   Soma helps but no one will write it. Flexeril doesn't help as much but he has that available A/P: I told patient I remain very concerned about the high risk medications he is on.  Luckily he is no longer on Fioricet with codeine or Soma.  He has Valium on hand from his psychiatrist but uses very rarely.  He is on Ritalin per psychiatrist as well.  I reviewed [facility] and his last soma prescription was from me before we stop this medication, his last oxycodone prescription was from me for daughter's wedding.   In 3 months he is not refilled Fioricet with codeine.  I told him I am going to use sparing Percocet that he can no longer use marijuana after today.  I did provide #15 of Percocet 5/325 but this will be the last time until we have already seen a negative urine drug screen.  I also advised him against drinking wine given his high risk medication use even though he only drinks 1 glass a day.     Migraine S:Off fiorcet and soma. botox has helped tremendously.  He is still receiving sumatriptan from his prior [facility] primary care physician A/P: I told patient I was very concerned about the sumatriptan.  I specifically asked him to ask his cardiologist about both this and the reported wine that he drinks per night one glass for heart health  I did not discuss this with patient today but I think it is less than ideal that he sees me for pain medication and a different primary care doctor for his sumatriptan-ideally we would consolidate care-with that being said I am not willing to prescribe sumatriptan until it is explicitly stated by his cardiologist that this is safe.  CAD (coronary artery disease) S: Patient continues to follow up with [place] cardiologist. He remains on asa, statin. No recent chest pain or shortness of rbeath A/P: Continue cardiology follow-up-once again asked him specifically to ask his cardiologist about sumatriptan use and 1 glass of wine a night-I would prefer he drinks no wine given high risk medication uses    No future appointments. No follow-ups on file.  Lab/Order associations: Chronic low back pain without sciatica, unspecified back pain laterality  Other migraine without status migrainosus, not intractable  Coronary artery disease involving native coronary artery of native heart without angina pectoris  Meds ordered this encounter  Medications  . oxyCODONE-acetaminophen (PERCOCET/ROXICET) 5-325 MG tablet    Sig: Take 1 tablet by mouth every 8 (eight) hours  as needed for up to 5 days for severe pain.    Dispense:  15 tablet    Refill:  0   Return precautions advised.  Tana ConchStephen Nafis Farnan, MD
